# Patient Record
Sex: Male | Born: 1960 | Race: White | Hispanic: No | Marital: Married | Smoking: Former smoker
Health system: Southern US, Community
[De-identification: ages and names within clinical notes are randomized; demographics above are authoritative.]

## PROBLEM LIST (undated history)

## (undated) DIAGNOSIS — B019 Varicella without complication: Secondary | ICD-10-CM

## (undated) DIAGNOSIS — E079 Disorder of thyroid, unspecified: Secondary | ICD-10-CM

## (undated) DIAGNOSIS — E785 Hyperlipidemia, unspecified: Secondary | ICD-10-CM

## (undated) DIAGNOSIS — E039 Hypothyroidism, unspecified: Secondary | ICD-10-CM

## (undated) HISTORY — DX: Hyperlipidemia, unspecified: E78.5

## (undated) HISTORY — DX: Varicella without complication: B01.9

## (undated) HISTORY — DX: Disorder of thyroid, unspecified: E07.9

## (undated) HISTORY — PX: OTHER SURGICAL HISTORY: SHX169

---

## 2015-07-14 ENCOUNTER — Encounter: Payer: Self-pay | Admitting: Family Medicine

## 2015-07-14 ENCOUNTER — Ambulatory Visit (INDEPENDENT_AMBULATORY_CARE_PROVIDER_SITE_OTHER): Payer: BLUE CROSS/BLUE SHIELD | Admitting: Family Medicine

## 2015-07-14 VITALS — BP 140/90 | HR 86 | Temp 98.3°F | Ht 69.0 in | Wt 193.3 lb

## 2015-07-14 DIAGNOSIS — R221 Localized swelling, mass and lump, neck: Secondary | ICD-10-CM | POA: Diagnosis not present

## 2015-07-14 DIAGNOSIS — E782 Mixed hyperlipidemia: Secondary | ICD-10-CM | POA: Insufficient documentation

## 2015-07-14 DIAGNOSIS — E039 Hypothyroidism, unspecified: Secondary | ICD-10-CM

## 2015-07-14 DIAGNOSIS — E785 Hyperlipidemia, unspecified: Secondary | ICD-10-CM | POA: Diagnosis not present

## 2015-07-14 LAB — T4, FREE: FREE T4: 1.01 ng/dL (ref 0.60–1.60)

## 2015-07-14 LAB — TSH: TSH: 1.75 u[IU]/mL (ref 0.35–4.50)

## 2015-07-14 NOTE — Progress Notes (Signed)
   Subjective:    Patient ID: Carl Barton, male    DOB: 1961-01-16, 55 y.o.   MRN: [id number]  HPI Patient seen to establish care. Past medical history significant for hypothyroidism and hyperlipidemia  Currently takes levothyroxin 100 g once daily. He is concerned because over the past few months he thinks he has had some gradual bilateral enlargement of his neck with some associated soreness bilaterally. He states he had his blood work checked for thyroid several months ago and normal. No history of known multinodular goiter or thyroiditis. No recent appetite or weight changes. No history of dysphagia or pain with swallowing  Hyperlipidemia treated with Crestor 10 mg daily. No history of CAD or peripheral vascular disease.  Complains of some general fatigue issues.  Usually gets about 6-7 hours sleep at night. Gets to sleep okay but frequently wakes up and unable to get back to sleep. No history of known obstructive sleep apnea. Wife has not observed any obvious apnea episodes. Minimal snoring.  Past Medical History  Diagnosis Date  . Chicken pox   . Thyroid disease    Past Surgical History  Procedure Laterality Date  . Never      reports that he quit smoking about 18 years ago. He does not have any smokeless tobacco history on file. He reports that he does not drink alcohol or use illicit drugs. family history includes Arthritis in his mother. Not on File    Review of Systems  Constitutional: Positive for fatigue.  Eyes: Negative for visual disturbance.  Respiratory: Negative for cough, chest tightness and shortness of breath.   Cardiovascular: Negative for chest pain, palpitations and leg swelling.  Endocrine: Negative for polydipsia and polyuria.  Genitourinary: Negative for dysuria.  Neurological: Negative for dizziness, syncope, weakness, light-headedness and headaches.       Objective:   Physical Exam  Constitutional: He appears well-developed and  well-nourished.  Neck: Neck supple.  Patient has somewhat prominent submandibular glands bilaterally. He also has some bilateral anterior cervical adenopathy. No definite lower neck masses palpated.  Cardiovascular: Normal rate and regular rhythm.  Exam reveals no gallop.   No murmur heard. Pulmonary/Chest: Effort normal and breath sounds normal. No respiratory distress. He has no wheezes. He has no rales.  Musculoskeletal: He exhibits no edema.          Assessment & Plan:  #1 hypothyroidism. Recheck TSH and free T4 #2 dyslipidemia. Patient on Crestor. Reportedly had lipids checked a few months ago and normal #3 anterior neck prominence. Check neck ultrasound to further assess.

## 2015-07-14 NOTE — Patient Instructions (Signed)
We will call you with neck ultrasound

## 2015-07-14 NOTE — Progress Notes (Signed)
Pre visit review using our clinic review tool, if applicable. No additional management support is needed unless otherwise documented below in the visit note. 

## 2015-07-21 ENCOUNTER — Ambulatory Visit
Admission: RE | Admit: 2015-07-21 | Discharge: 2015-07-21 | Disposition: A | Discharge status: Home / Self Care | Payer: BLUE CROSS/BLUE SHIELD | Source: Ambulatory Visit | Attending: Family Medicine | Admitting: Family Medicine

## 2015-07-21 DIAGNOSIS — R221 Localized swelling, mass and lump, neck: Secondary | ICD-10-CM

## 2015-07-31 LAB — LIPID PANEL
CHOLESTEROL: 143 mg/dL (ref 0–200)
HDL: 33 mg/dL — AB (ref 35–70)
LDL Cholesterol: 75 mg/dL
Triglycerides: 177 mg/dL — AB (ref 40–160)

## 2015-07-31 LAB — HEPATIC FUNCTION PANEL
ALT: 18 U/L (ref 10–40)
AST: 14 U/L (ref 14–40)
Alkaline Phosphatase: 83 U/L (ref 25–125)
Bilirubin, Total: 0.4 mg/dL

## 2015-07-31 LAB — BASIC METABOLIC PANEL
BUN: 13 mg/dL (ref 4–21)
CREATININE: 0.9 mg/dL (ref 0.6–1.3)
Glucose: 102 mg/dL
POTASSIUM: 4.1 mmol/L (ref 3.4–5.3)
SODIUM: 143 mmol/L (ref 137–147)

## 2015-08-21 DIAGNOSIS — E782 Mixed hyperlipidemia: Secondary | ICD-10-CM | POA: Diagnosis not present

## 2015-08-21 DIAGNOSIS — E039 Hypothyroidism, unspecified: Secondary | ICD-10-CM | POA: Diagnosis not present

## 2015-08-21 DIAGNOSIS — Z008 Encounter for other general examination: Secondary | ICD-10-CM | POA: Diagnosis not present

## 2015-08-21 DIAGNOSIS — E559 Vitamin D deficiency, unspecified: Secondary | ICD-10-CM | POA: Diagnosis not present

## 2015-09-12 ENCOUNTER — Encounter: Payer: Self-pay | Admitting: Family Medicine

## 2016-01-31 DIAGNOSIS — Z139 Encounter for screening, unspecified: Secondary | ICD-10-CM | POA: Diagnosis not present

## 2016-01-31 DIAGNOSIS — E039 Hypothyroidism, unspecified: Secondary | ICD-10-CM | POA: Diagnosis not present

## 2016-01-31 DIAGNOSIS — Z008 Encounter for other general examination: Secondary | ICD-10-CM | POA: Diagnosis not present

## 2016-01-31 DIAGNOSIS — Z79899 Other long term (current) drug therapy: Secondary | ICD-10-CM | POA: Diagnosis not present

## 2016-01-31 DIAGNOSIS — E559 Vitamin D deficiency, unspecified: Secondary | ICD-10-CM | POA: Diagnosis not present

## 2016-01-31 DIAGNOSIS — E782 Mixed hyperlipidemia: Secondary | ICD-10-CM | POA: Diagnosis not present

## 2016-02-21 DIAGNOSIS — E559 Vitamin D deficiency, unspecified: Secondary | ICD-10-CM | POA: Diagnosis not present

## 2016-02-21 DIAGNOSIS — E039 Hypothyroidism, unspecified: Secondary | ICD-10-CM | POA: Diagnosis not present

## 2016-02-21 DIAGNOSIS — Z008 Encounter for other general examination: Secondary | ICD-10-CM | POA: Diagnosis not present

## 2016-02-21 DIAGNOSIS — E782 Mixed hyperlipidemia: Secondary | ICD-10-CM | POA: Diagnosis not present

## 2016-02-27 DIAGNOSIS — Z23 Encounter for immunization: Secondary | ICD-10-CM | POA: Diagnosis not present

## 2016-06-03 DIAGNOSIS — E782 Mixed hyperlipidemia: Secondary | ICD-10-CM | POA: Diagnosis not present

## 2016-06-03 DIAGNOSIS — Z008 Encounter for other general examination: Secondary | ICD-10-CM | POA: Diagnosis not present

## 2016-06-03 DIAGNOSIS — Z79899 Other long term (current) drug therapy: Secondary | ICD-10-CM | POA: Diagnosis not present

## 2016-06-03 DIAGNOSIS — E559 Vitamin D deficiency, unspecified: Secondary | ICD-10-CM | POA: Diagnosis not present

## 2016-06-03 DIAGNOSIS — E039 Hypothyroidism, unspecified: Secondary | ICD-10-CM | POA: Diagnosis not present

## 2016-06-24 DIAGNOSIS — E039 Hypothyroidism, unspecified: Secondary | ICD-10-CM | POA: Diagnosis not present

## 2016-06-24 DIAGNOSIS — E782 Mixed hyperlipidemia: Secondary | ICD-10-CM | POA: Diagnosis not present

## 2016-06-24 DIAGNOSIS — E559 Vitamin D deficiency, unspecified: Secondary | ICD-10-CM | POA: Diagnosis not present

## 2016-09-19 IMAGING — US US SOFT TISSUE HEAD/NECK
1 series · 14 of 25 positions shown · non-contrast
Comparison: None.

CLINICAL DATA: 54-year-old male with a history of neck puffiness

EXAM:
ULTRASOUND OF HEAD/NECK SOFT TISSUES
TECHNIQUE: Ultrasound examination of the head and neck soft tissues was
performed in the area of clinical concern.

[Series 1: us soft tissue head/neck · 0.08mm/px · 14 of 33 slices shown]
[im 1/33]
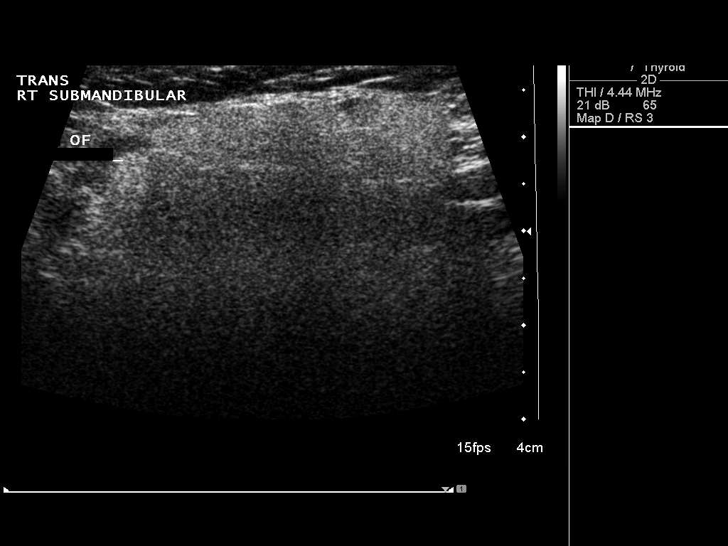
[im 3/33]
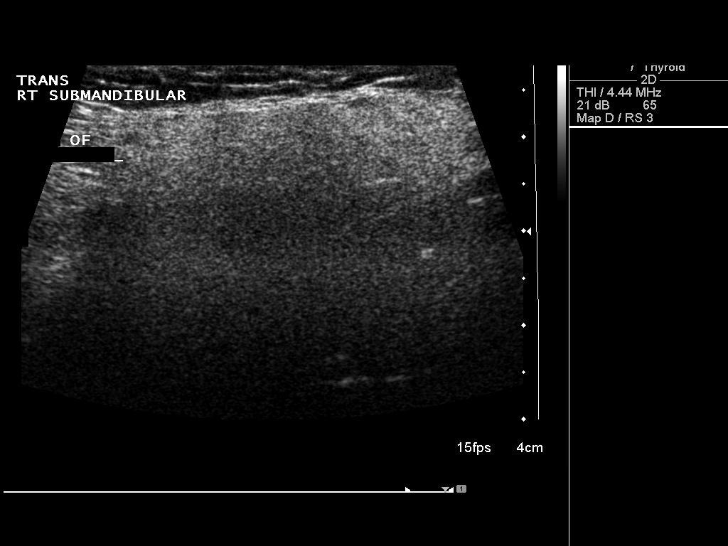
[im 6/33]
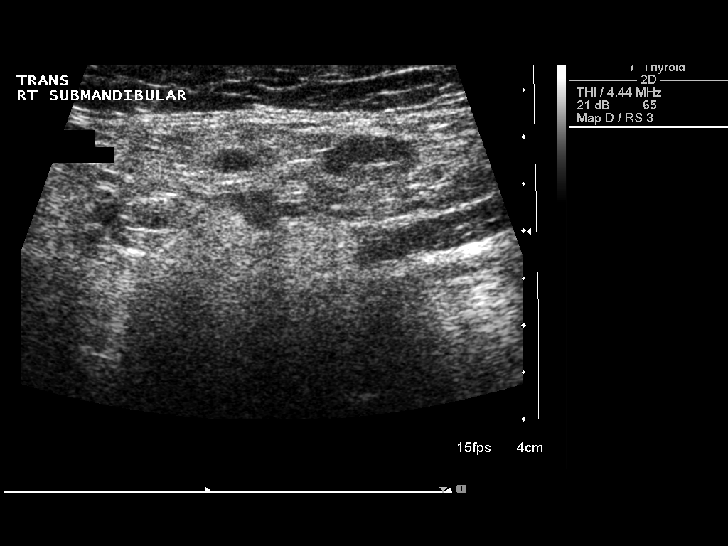
[im 9/33]
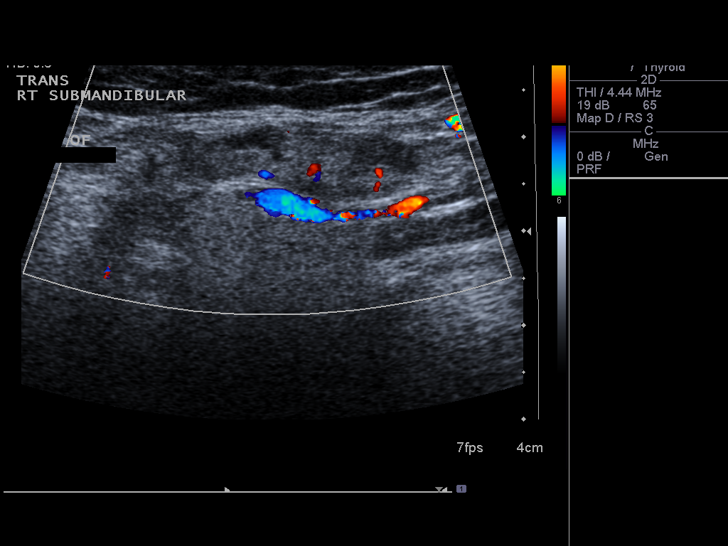
[im 11/33]
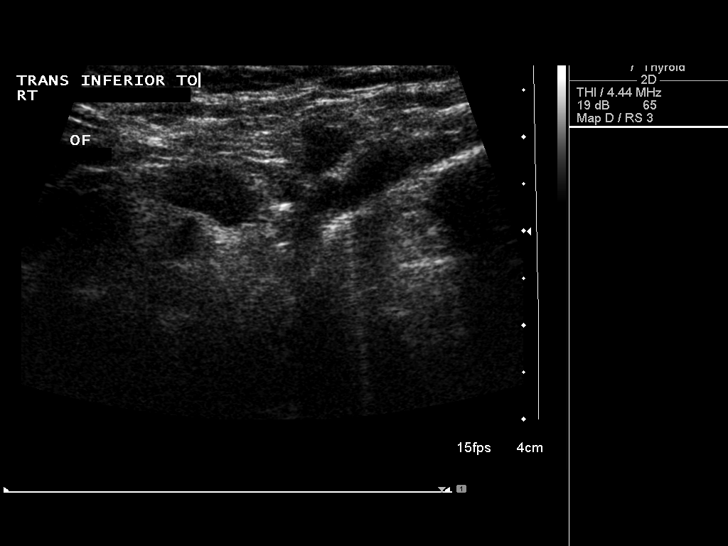
[im 13/33]
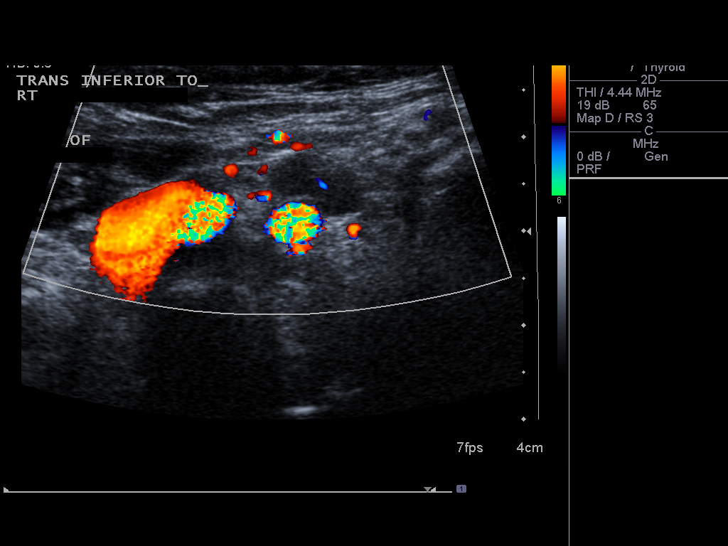
[im 15/33]
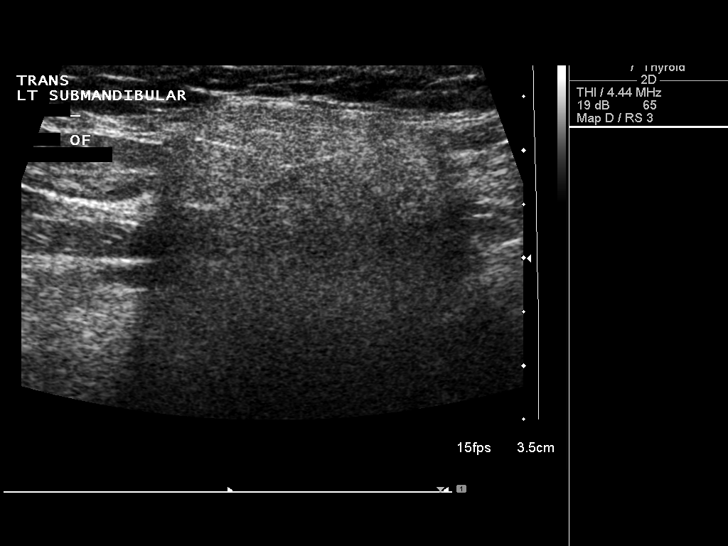
[im 18/33]
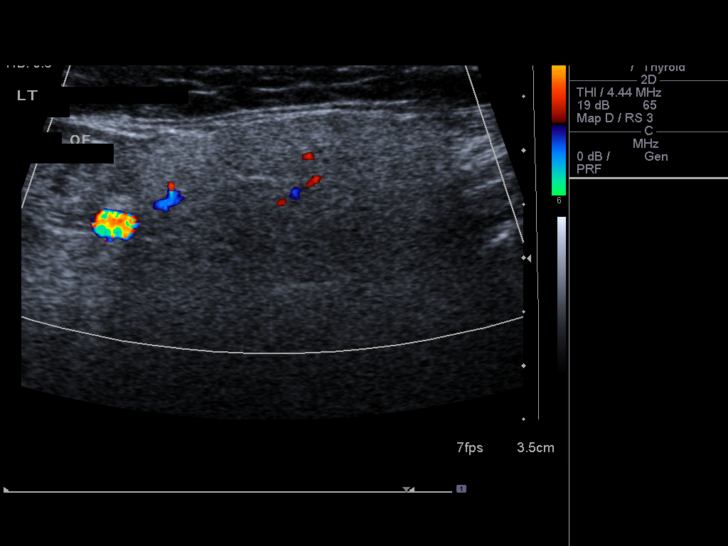
[im 21/33]
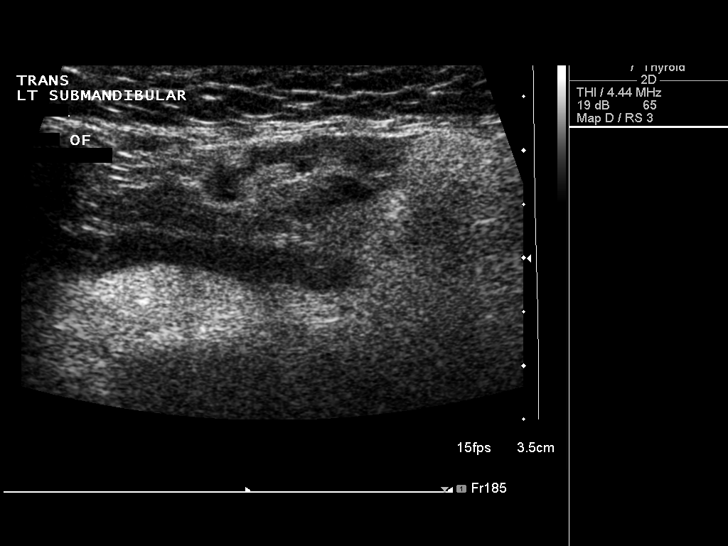
[im 22/33]
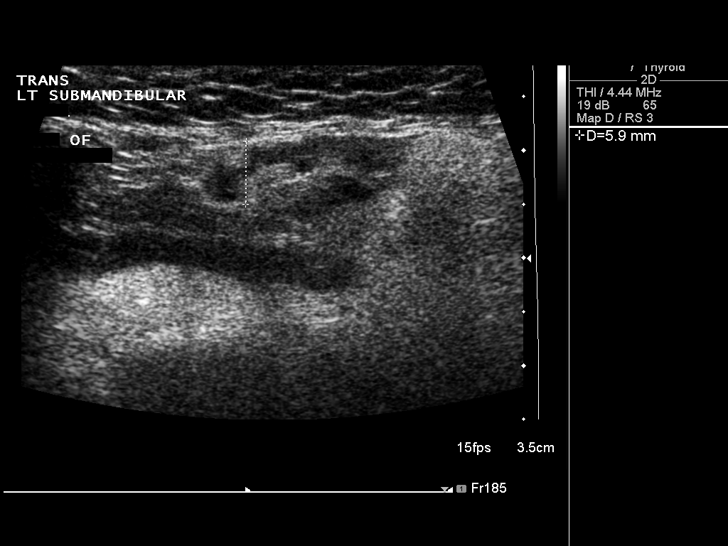
[im 25/33]
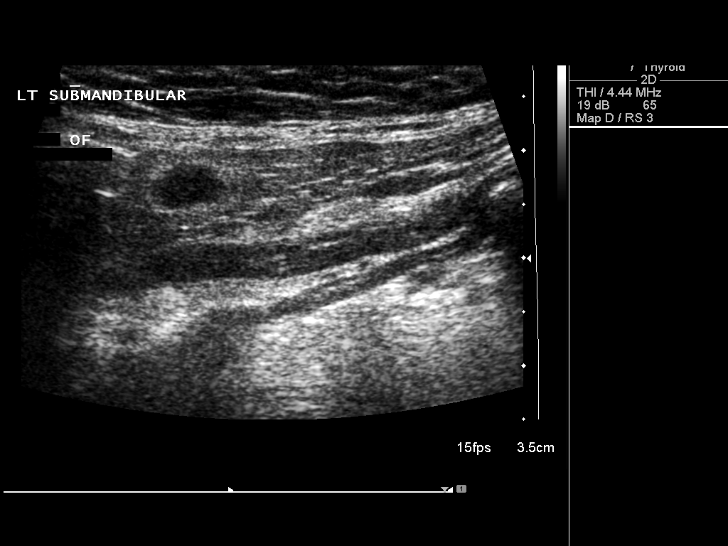
[im 27/33]
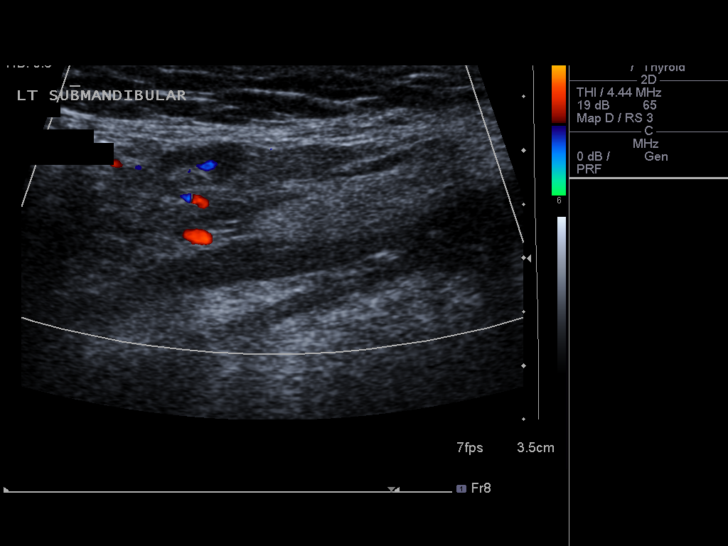
[im 30/33]
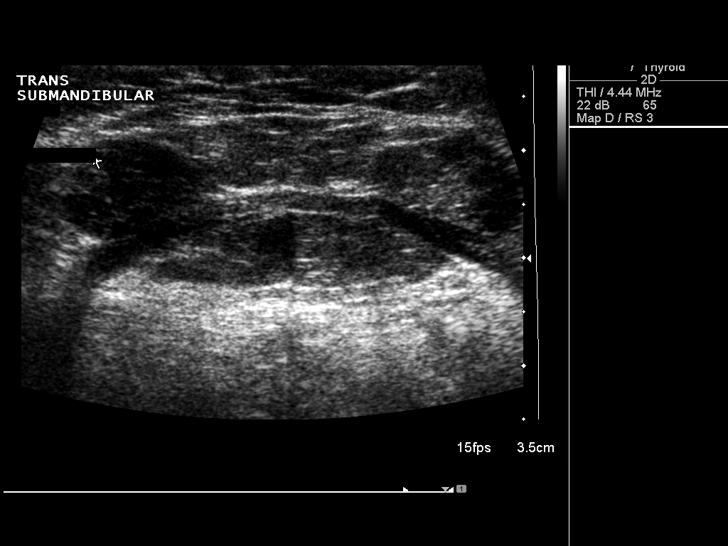
[im 33/33]
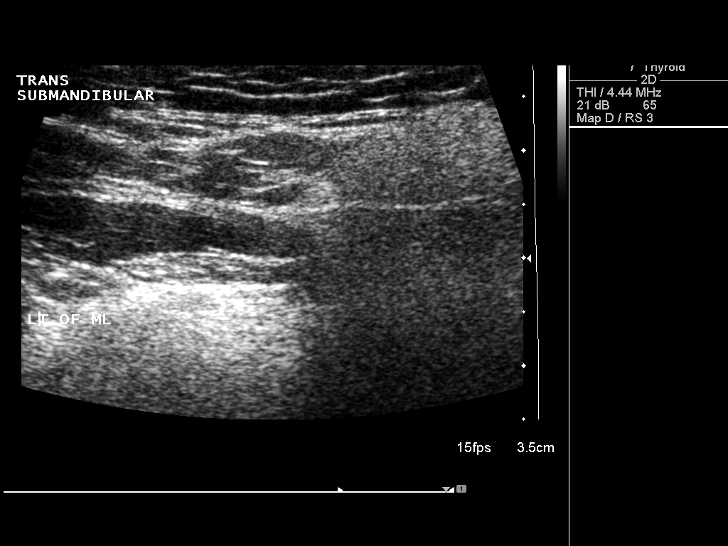

[14 of 25 positions shown; findings below may reference images not displayed]

FINDINGS: Grayscale and color duplex imaging in the region of clinical concern
performed.

No focal fluid collection. No soft tissue lesion. No calcifications.

Heterogeneous appearance of the submandibular glands, typical for
salivary tissue.

Lymph nodes are present, which are not enlarged by head and neck
criteria.
IMPRESSION: Unremarkable sonographic survey in the region of clinical concern.

## 2016-10-09 DIAGNOSIS — E782 Mixed hyperlipidemia: Secondary | ICD-10-CM | POA: Diagnosis not present

## 2016-10-09 DIAGNOSIS — Z719 Counseling, unspecified: Secondary | ICD-10-CM | POA: Diagnosis not present

## 2016-10-09 DIAGNOSIS — Z79899 Other long term (current) drug therapy: Secondary | ICD-10-CM | POA: Diagnosis not present

## 2016-10-09 DIAGNOSIS — E039 Hypothyroidism, unspecified: Secondary | ICD-10-CM | POA: Diagnosis not present

## 2016-10-23 DIAGNOSIS — E782 Mixed hyperlipidemia: Secondary | ICD-10-CM | POA: Diagnosis not present

## 2016-10-23 DIAGNOSIS — E559 Vitamin D deficiency, unspecified: Secondary | ICD-10-CM | POA: Diagnosis not present

## 2016-10-23 DIAGNOSIS — Z008 Encounter for other general examination: Secondary | ICD-10-CM | POA: Diagnosis not present

## 2016-10-23 DIAGNOSIS — Z719 Counseling, unspecified: Secondary | ICD-10-CM | POA: Diagnosis not present

## 2016-10-23 DIAGNOSIS — E039 Hypothyroidism, unspecified: Secondary | ICD-10-CM | POA: Diagnosis not present

## 2016-11-01 ENCOUNTER — Encounter: Payer: Self-pay | Admitting: Family Medicine

## 2016-11-01 ENCOUNTER — Ambulatory Visit (INDEPENDENT_AMBULATORY_CARE_PROVIDER_SITE_OTHER): Payer: BLUE CROSS/BLUE SHIELD | Admitting: Family Medicine

## 2016-11-01 VITALS — BP 133/79 | HR 89 | Temp 97.0°F | Ht 69.0 in | Wt 188.0 lb

## 2016-11-01 DIAGNOSIS — E781 Pure hyperglyceridemia: Secondary | ICD-10-CM

## 2016-11-01 DIAGNOSIS — Z0000 Encounter for general adult medical examination without abnormal findings: Secondary | ICD-10-CM | POA: Diagnosis not present

## 2016-11-01 MED ORDER — ICOSAPENT ETHYL 1 G PO CAPS: 2.0000 g | ORAL_CAPSULE | Freq: Two times a day (BID) | ORAL | 3 refills | Status: DC

## 2016-11-01 NOTE — Progress Notes (Signed)
   HPI  Patient presents today for an annual physical exam.  Patient feels well, has no complaints.  Patient has history of hyperlipidemia and hypertriglyceridemia, he is watching his diet moderately. He is occupationally active. He has tried Lipitor, Crestor, to other statins, and fenofibrate which I'll start/cause myalgias.  He would like to get a colonoscopy.  He does not have any prostate symptoms, no nocturia. PSA has been checked by occupational provider and is normal.  He brings in serial labs which were reviewed.  PMH: Chickenpox, hyperlipidemia, hypothyroidism Family history: Arthritis in mother Surgical history: Never Social history: No alcohol or drug use. Former smoker ROS: Per HPI  Objective: BP 133/79   Pulse 89   Temp 97 F (36.1 C) (Oral)   Ht 5\' 9"  (1.753 m)   Wt 188 lb (85.3 kg)   BMI 27.76 kg/m  Gen: NAD, alert, cooperative with exam HEENT: NCAT, EOMI, PERRL, oropharynx clear, nares clear CV: RRR, good S1/S2, no murmur Resp: CTABL, no wheezes, non-labored Abd: SNTND, BS present, no guarding or organomegaly Ext: No edema, warm Neuro: Alert and oriented, 2+ symmetric patellar tendon reflexes, strength 5/5 in bilateral lower extremities  Plan:    Annual physical exam Normal exam, slightly overweight- therapeutic lifestyle changes Sent to GI for colonoscopy  Hypertriglyceridemia Start Vascepa Statin intolerance, does have HLD as well.   # hypothyroidism TSH controlled, no change in synthroid dose    Murtis SinkSam Samary Shatz, MD [facility] 11/01/2016, 1:34 PM

## 2016-11-01 NOTE — Patient Instructions (Signed)
Great to meet you!   Health Maintenance, Male A healthy lifestyle and preventive care is important for your health and wellness. Ask your health care provider about what schedule of regular examinations is right for you. What should I know about weight and diet? Eat a Healthy Diet  Eat plenty of vegetables, fruits, whole grains, low-fat dairy products, and lean protein.  Do not eat a lot of foods high in solid fats, added sugars, or salt.  Maintain a Healthy Weight Regular exercise can help you achieve or maintain a healthy weight. You should:  Do at least 150 minutes of exercise each week. The exercise should increase your heart rate and make you sweat (moderate-intensity exercise).  Do strength-training exercises at least twice a week.  Watch Your Levels of Cholesterol and Blood Lipids  Have your blood tested for lipids and cholesterol every 5 years starting at 58 years of age. If you are at high risk for heart disease, you should start having your blood tested when you are 58 years old. You may need to have your cholesterol levels checked more often if: ? Your lipid or cholesterol levels are high. ? You are older than 58 years of age. ? You are at high risk for heart disease.  What should I know about cancer screening? Many types of cancers can be detected early and may often be prevented. Lung Cancer  You should be screened every year for lung cancer if: ? You are a current smoker who has smoked for at least 30 years. ? You are a former smoker who has quit within the past 15 years.  Talk to your health care provider about your screening options, when you should start screening, and how often you should be screened.  Colorectal Cancer  Routine colorectal cancer screening usually begins at 58 years of age and should be repeated every 5-10 years until you are 58 years old. You may need to be screened more often if early forms of precancerous polyps or small growths are found.  Your health care provider may recommend screening at an earlier age if you have risk factors for colon cancer.  Your health care provider may recommend using home test kits to check for hidden blood in the stool.  A small camera at the end of a tube can be used to examine your colon (sigmoidoscopy or colonoscopy). This checks for the earliest forms of colorectal cancer.  Prostate and Testicular Cancer  Depending on your age and overall health, your health care provider may do certain tests to screen for prostate and testicular cancer.  Talk to your health care provider about any symptoms or concerns you have about testicular or prostate cancer.  Skin Cancer  Check your skin from head to toe regularly.  Tell your health care provider about any new moles or changes in moles, especially if: ? There is a change in a mole's size, shape, or color. ? You have a mole that is larger than a pencil eraser.  Always use sunscreen. Apply sunscreen liberally and repeat throughout the day.  Protect yourself by wearing long sleeves, pants, a wide-brimmed hat, and sunglasses when outside.  What should I know about heart disease, diabetes, and high blood pressure?  If you are 18-39 years of age, have your blood pressure checked every 3-5 years. If you are 40 years of age or older, have your blood pressure checked every year. You should have your blood pressure measured twice-once when you are at a   hospital or clinic, and once when you are not at a hospital or clinic. Record the average of the two measurements. To check your blood pressure when you are not at a hospital or clinic, you can use: ? An automated blood pressure machine at a pharmacy. ? A home blood pressure monitor.  Talk to your health care provider about your target blood pressure.  If you are between 45-79 years old, ask your health care provider if you should take aspirin to prevent heart disease.  Have regular diabetes screenings by  checking your fasting blood sugar level. ? If you are at a normal weight and have a low risk for diabetes, have this test once every three years after the age of 45. ? If you are overweight and have a high risk for diabetes, consider being tested at a younger age or more often.  A one-time screening for abdominal aortic aneurysm (AAA) by ultrasound is recommended for men aged 65-75 years who are current or former smokers. What should I know about preventing infection? Hepatitis B If you have a higher risk for hepatitis B, you should be screened for this virus. Talk with your health care provider to find out if you are at risk for hepatitis B infection. Hepatitis C Blood testing is recommended for:  Everyone born from 1945 through 1965.  Anyone with known risk factors for hepatitis C.  Sexually Transmitted Diseases (STDs)  You should be screened each year for STDs including gonorrhea and chlamydia if: ? You are sexually active and are younger than 58 years of age. ? You are older than 58 years of age and your health care provider tells you that you are at risk for this type of infection. ? Your sexual activity has changed since you were last screened and you are at an increased risk for chlamydia or gonorrhea. Ask your health care provider if you are at risk.  Talk with your health care provider about whether you are at high risk of being infected with HIV. Your health care provider may recommend a prescription medicine to help prevent HIV infection.  What else can I do?  Schedule regular health, dental, and eye exams.  Stay current with your vaccines (immunizations).  Do not use any tobacco products, such as cigarettes, chewing tobacco, and e-cigarettes. If you need help quitting, ask your health care provider.  Limit alcohol intake to no more than 2 drinks per day. One drink equals 12 ounces of beer, 5 ounces of wine, or 1 ounces of hard liquor.  Do not use street drugs.  Do not  share needles.  Ask your health care provider for help if you need support or information about quitting drugs.  Tell your health care provider if you often feel depressed.  Tell your health care provider if you have ever been abused or do not feel safe at home. This information is not intended to replace advice given to you by your health care provider. Make sure you discuss any questions you have with your health care provider. Document Released: 11/02/2007 Document Revised: 01/03/2016 Document Reviewed: 02/07/2015 Elsevier Interactive Patient Education  2018 [employer]  

## 2016-12-13 DIAGNOSIS — Z01818 Encounter for other preprocedural examination: Secondary | ICD-10-CM | POA: Diagnosis not present

## 2016-12-30 DIAGNOSIS — Z008 Encounter for other general examination: Secondary | ICD-10-CM | POA: Diagnosis not present

## 2016-12-30 DIAGNOSIS — E039 Hypothyroidism, unspecified: Secondary | ICD-10-CM | POA: Diagnosis not present

## 2016-12-30 DIAGNOSIS — E782 Mixed hyperlipidemia: Secondary | ICD-10-CM | POA: Diagnosis not present

## 2016-12-30 DIAGNOSIS — E559 Vitamin D deficiency, unspecified: Secondary | ICD-10-CM | POA: Diagnosis not present

## 2016-12-30 DIAGNOSIS — Z719 Counseling, unspecified: Secondary | ICD-10-CM | POA: Diagnosis not present

## 2016-12-30 DIAGNOSIS — E663 Overweight: Secondary | ICD-10-CM | POA: Diagnosis not present

## 2017-01-08 DIAGNOSIS — D126 Benign neoplasm of colon, unspecified: Secondary | ICD-10-CM | POA: Diagnosis not present

## 2017-01-08 DIAGNOSIS — Z1211 Encounter for screening for malignant neoplasm of colon: Secondary | ICD-10-CM | POA: Diagnosis not present

## 2017-01-08 DIAGNOSIS — K6289 Other specified diseases of anus and rectum: Secondary | ICD-10-CM | POA: Diagnosis not present

## 2017-01-08 DIAGNOSIS — K644 Residual hemorrhoidal skin tags: Secondary | ICD-10-CM | POA: Diagnosis not present

## 2017-01-08 DIAGNOSIS — K648 Other hemorrhoids: Secondary | ICD-10-CM | POA: Diagnosis not present

## 2017-01-13 DIAGNOSIS — D126 Benign neoplasm of colon, unspecified: Secondary | ICD-10-CM | POA: Diagnosis not present

## 2017-02-10 DIAGNOSIS — Z79899 Other long term (current) drug therapy: Secondary | ICD-10-CM | POA: Diagnosis not present

## 2017-02-10 DIAGNOSIS — E782 Mixed hyperlipidemia: Secondary | ICD-10-CM | POA: Diagnosis not present

## 2017-02-10 DIAGNOSIS — Z139 Encounter for screening, unspecified: Secondary | ICD-10-CM | POA: Diagnosis not present

## 2017-02-10 DIAGNOSIS — E039 Hypothyroidism, unspecified: Secondary | ICD-10-CM | POA: Diagnosis not present

## 2017-02-10 DIAGNOSIS — E559 Vitamin D deficiency, unspecified: Secondary | ICD-10-CM | POA: Diagnosis not present

## 2017-02-19 DIAGNOSIS — Z008 Encounter for other general examination: Secondary | ICD-10-CM | POA: Diagnosis not present

## 2017-02-19 DIAGNOSIS — E559 Vitamin D deficiency, unspecified: Secondary | ICD-10-CM | POA: Diagnosis not present

## 2017-02-19 DIAGNOSIS — E039 Hypothyroidism, unspecified: Secondary | ICD-10-CM | POA: Diagnosis not present

## 2017-02-19 DIAGNOSIS — E663 Overweight: Secondary | ICD-10-CM | POA: Diagnosis not present

## 2017-02-19 DIAGNOSIS — Z719 Counseling, unspecified: Secondary | ICD-10-CM | POA: Diagnosis not present

## 2017-02-19 DIAGNOSIS — E782 Mixed hyperlipidemia: Secondary | ICD-10-CM | POA: Diagnosis not present

## 2017-07-21 ENCOUNTER — Encounter: Payer: Self-pay | Admitting: Family Medicine

## 2017-07-21 DIAGNOSIS — Z139 Encounter for screening, unspecified: Secondary | ICD-10-CM | POA: Diagnosis not present

## 2017-07-21 DIAGNOSIS — Z79899 Other long term (current) drug therapy: Secondary | ICD-10-CM | POA: Diagnosis not present

## 2017-07-21 DIAGNOSIS — Z719 Counseling, unspecified: Secondary | ICD-10-CM | POA: Diagnosis not present

## 2017-07-21 DIAGNOSIS — E559 Vitamin D deficiency, unspecified: Secondary | ICD-10-CM | POA: Diagnosis not present

## 2017-07-21 DIAGNOSIS — Z008 Encounter for other general examination: Secondary | ICD-10-CM | POA: Diagnosis not present

## 2017-07-21 DIAGNOSIS — E039 Hypothyroidism, unspecified: Secondary | ICD-10-CM | POA: Diagnosis not present

## 2017-08-04 DIAGNOSIS — E559 Vitamin D deficiency, unspecified: Secondary | ICD-10-CM | POA: Diagnosis not present

## 2017-08-04 DIAGNOSIS — E039 Hypothyroidism, unspecified: Secondary | ICD-10-CM | POA: Diagnosis not present

## 2017-08-04 DIAGNOSIS — E782 Mixed hyperlipidemia: Secondary | ICD-10-CM | POA: Diagnosis not present

## 2017-10-06 DIAGNOSIS — Z719 Counseling, unspecified: Secondary | ICD-10-CM | POA: Diagnosis not present

## 2017-10-06 DIAGNOSIS — E039 Hypothyroidism, unspecified: Secondary | ICD-10-CM | POA: Diagnosis not present

## 2017-10-06 DIAGNOSIS — E782 Mixed hyperlipidemia: Secondary | ICD-10-CM | POA: Diagnosis not present

## 2017-10-06 DIAGNOSIS — E559 Vitamin D deficiency, unspecified: Secondary | ICD-10-CM | POA: Diagnosis not present

## 2017-10-06 DIAGNOSIS — E663 Overweight: Secondary | ICD-10-CM | POA: Diagnosis not present

## 2017-10-06 DIAGNOSIS — Z008 Encounter for other general examination: Secondary | ICD-10-CM | POA: Diagnosis not present

## 2017-12-03 DIAGNOSIS — Z008 Encounter for other general examination: Secondary | ICD-10-CM | POA: Diagnosis not present

## 2017-12-03 DIAGNOSIS — E559 Vitamin D deficiency, unspecified: Secondary | ICD-10-CM | POA: Diagnosis not present

## 2017-12-03 DIAGNOSIS — E039 Hypothyroidism, unspecified: Secondary | ICD-10-CM | POA: Diagnosis not present

## 2017-12-03 DIAGNOSIS — Z719 Counseling, unspecified: Secondary | ICD-10-CM | POA: Diagnosis not present

## 2018-01-28 DIAGNOSIS — Z79899 Other long term (current) drug therapy: Secondary | ICD-10-CM | POA: Diagnosis not present

## 2018-01-28 DIAGNOSIS — Z139 Encounter for screening, unspecified: Secondary | ICD-10-CM | POA: Diagnosis not present

## 2018-01-28 DIAGNOSIS — E782 Mixed hyperlipidemia: Secondary | ICD-10-CM | POA: Diagnosis not present

## 2018-01-28 DIAGNOSIS — Z0130 Encounter for examination of blood pressure without abnormal findings: Secondary | ICD-10-CM | POA: Diagnosis not present

## 2018-01-28 DIAGNOSIS — E559 Vitamin D deficiency, unspecified: Secondary | ICD-10-CM | POA: Diagnosis not present

## 2018-01-28 DIAGNOSIS — E039 Hypothyroidism, unspecified: Secondary | ICD-10-CM | POA: Diagnosis not present

## 2018-02-10 ENCOUNTER — Encounter: Payer: Self-pay | Admitting: Family Medicine

## 2018-02-10 ENCOUNTER — Ambulatory Visit (INDEPENDENT_AMBULATORY_CARE_PROVIDER_SITE_OTHER): Payer: BLUE CROSS/BLUE SHIELD | Admitting: Family Medicine

## 2018-02-10 VITALS — BP 138/82 | HR 86 | Temp 98.5°F | Ht 69.0 in | Wt 188.0 lb

## 2018-02-10 DIAGNOSIS — E039 Hypothyroidism, unspecified: Secondary | ICD-10-CM

## 2018-02-10 DIAGNOSIS — E782 Mixed hyperlipidemia: Secondary | ICD-10-CM

## 2018-02-10 DIAGNOSIS — Z0000 Encounter for general adult medical examination without abnormal findings: Secondary | ICD-10-CM

## 2018-02-10 DIAGNOSIS — R030 Elevated blood-pressure reading, without diagnosis of hypertension: Secondary | ICD-10-CM

## 2018-02-10 DIAGNOSIS — Z23 Encounter for immunization: Secondary | ICD-10-CM

## 2018-02-10 NOTE — Progress Notes (Signed)
Subjective:    Patient ID: Carl Barton, male    DOB: November 24, 1960, 57 y.o.   MRN: [id number]  Chief Complaint:  Annual Exam   HPI: Carl Barton is a 57 y.o. male presenting on 02/10/2018 for Annual Exam  Pt presents today for his annual physical exam. Pt states he is ding well overall.  Pt reports he has been watching his diet and has increased his physical activity. Pt reports he continues to take fish oil, as he was intolerant to statins. Pt denies chest pain, headaches, shortness of breath, or dizziness. Pt is also compliant with his levothyroxine. He denies cold or heat intolerance, dry skin, constipation/diarrhea, fatigue, or mood changes. He denies complaints or concerns. Pt denies urinary symptoms such as nocturia, urgency, or hesitancy. Pt does want to have his PSA checked today.   Pt states he had his colonoscopy at [facility] in [place] in August 2018, we will request results.   Pt states he just had blood work done at work this week. Upon reviewing records from previous blood draws from his employer it is noted they check a CMP and lipid panel. Other labs will be drawn today.   1. Annual physical exam   2. Hypothyroidism, unspecified type   3. Mixed hyperlipidemia   4. Elevated blood pressure reading without diagnosis of hypertension      Relevant past medical, surgical, family and social history reviewed and updated as indicated. Interim medical history since our last visit reviewed. Allergies and medications reviewed and updated. DATA REVIEWED: CHART IN EPIC  Family History reviewed for pertinent findings.  Past Medical History:  Diagnosis Date  . Chicken pox   . Hyperlipidemia   . Thyroid disease     Past Surgical History:  Procedure Laterality Date  . never      Social History   Socioeconomic History  . Marital status: Married    Spouse name: Not on file  . Number of children: Not on file  . Years of education: Not on file  .  Highest education level: Not on file  Occupational History  . Not on file  Social Needs  . Financial resource strain: Not on file  . Food insecurity:    Worry: Not on file    Inability: Not on file  . Transportation needs:    Medical: Not on file    Non-medical: Not on file  Tobacco Use  . Smoking status: Former Smoker    Packs/day: 1.00    Years: 15.00    Pack years: 15.00    Types: Cigarettes    Last attempt to quit: 05/20/1997    Years since quitting: 20.7  . Smokeless tobacco: Never Used  Substance and Sexual Activity  . Alcohol use: No    Alcohol/week: 0.0 standard drinks  . Drug use: No  . Sexual activity: Yes  Lifestyle  . Physical activity:    Days per week: Not on file    Minutes per session: Not on file  . Stress: Not on file  Relationships  . Social connections:    Talks on phone: Not on file    Gets together: Not on file    Attends religious service: Not on file    Active member of club or organization: Not on file    Attends meetings of clubs or organizations: Not on file    Relationship status: Not on file  . Intimate partner violence:    Fear of current or  ex partner: Not on file    Emotionally abused: Not on file    Physically abused: Not on file    Forced sexual activity: Not on file  Other Topics Concern  . Not on file  Social History Narrative  . Not on file    Outpatient Encounter Medications as of 02/10/2018  Medication Sig  . levothyroxine (SYNTHROID, LEVOTHROID) 100 MCG tablet Take 100 mcg by mouth daily before breakfast.  . [DISCONTINUED] Icosapent Ethyl (VASCEPA) 1 g CAPS Take 2 g by mouth 2 (two) times daily.   No facility-administered encounter medications on file as of 02/10/2018.     No Known Allergies  Review of Systems  Constitutional: Negative for activity change, appetite change, chills, fatigue and fever.  HENT: Negative.   Eyes: Negative.   Respiratory: Negative for cough, chest tightness and shortness of breath.     Cardiovascular: Negative for chest pain, palpitations and leg swelling.  Gastrointestinal: Negative for blood in stool, constipation, diarrhea, nausea and vomiting.  Endocrine: Negative.  Negative for cold intolerance, heat intolerance, polydipsia, polyphagia and polyuria.  Genitourinary: Negative for decreased urine volume, difficulty urinating, dysuria, enuresis, frequency, hematuria and urgency.  Musculoskeletal: Negative for arthralgias and myalgias.  Skin: Negative.   Allergic/Immunologic: Negative.   Neurological: Negative for dizziness, weakness, light-headedness, numbness and headaches.  Hematological: Negative.   Psychiatric/Behavioral: Negative for confusion, hallucinations, sleep disturbance and suicidal ideas.  All other systems reviewed and are negative.       Objective:    BP 138/82 (BP Location: Right Arm, Cuff Size: Normal)   Pulse 86   Temp 98.5 F (36.9 C) (Oral)   Ht 5\' 9"  (1.753 m)   Wt 188 lb (85.3 kg)   BMI 27.76 kg/m    BP Readings from Last 3 Encounters:  02/10/18 138/82  11/01/16 133/79  07/14/15 140/90     Wt Readings from Last 3 Encounters:  02/10/18 188 lb (85.3 kg)  11/01/16 188 lb (85.3 kg)  07/14/15 193 lb 4.8 oz (87.7 kg)    Physical Exam  Constitutional: He is oriented to person, place, and time. He appears well-developed and well-nourished. He is cooperative. No distress.  HENT:  Head: Normocephalic and atraumatic.  Right Ear: Hearing, tympanic membrane, external ear and ear canal normal.  Left Ear: Hearing, tympanic membrane, external ear and ear canal normal.  Nose: Nose normal.  Mouth/Throat: Uvula is midline, oropharynx is clear and moist and mucous membranes are normal.  Eyes: Pupils are equal, round, and reactive to light. Conjunctivae, EOM and lids are normal.  Neck: Trachea normal, normal range of motion, full passive range of motion without pain and phonation normal. Neck supple. No tracheal deviation present. No thyromegaly  present.  Cardiovascular: Normal rate, regular rhythm, S1 normal, S2 normal, normal heart sounds, intact distal pulses and normal pulses. Exam reveals no gallop and no friction rub.  No murmur heard. Pulses:      Dorsalis pedis pulses are 2+ on the right side, and 2+ on the left side.       Posterior tibial pulses are 2+ on the right side, and 2+ on the left side.  Pulmonary/Chest: Effort normal and breath sounds normal. No respiratory distress. He has no wheezes.  Abdominal: Soft. Bowel sounds are normal. There is no hepatosplenomegaly. There is no tenderness. No hernia.  Musculoskeletal: Normal range of motion.  Lymphadenopathy:    He has no cervical adenopathy.  Neurological: He is alert and oriented to person, place, and time. He  has normal strength and normal reflexes. He displays a negative Romberg sign. GCS eye subscore is 4. GCS verbal subscore is 5. GCS motor subscore is 6.  Skin: Skin is warm and dry. Capillary refill takes less than 2 seconds. He is not diaphoretic.  Psychiatric: He has a normal mood and affect. His speech is normal and behavior is normal. Judgment and thought content normal.  Nursing note and vitals reviewed.       Assessment & Plan:   1. Annual physical exam Labs pending. Healthy lifestyle, diet, and exercise discussed.  - PSA, total and free - CBC with Differential/Platelet - TSH  2. Hypothyroidism, unspecified type Compliant with levothyroxine. Labs pending - TSH  3. Mixed hyperlipidemia Unable to tolerate statins, on daily over the counter fish oil. Diet and exercise encouraged. Awaiting lab results from employer.   4. Elevated blood pressure reading without diagnosis of hypertension Blood pressure is noted to be elevated today. DASH diet, keep a log of blood pressure over the next few weeks and bring in to follow-up visit. Diet and exercise discussed.   Continue all other maintenance medications.  Follow up plan: Return in about 6 months  (around 08/11/2018), or if symptoms worsen or fail to improve.  Educational handout given for health maintenance, DASH diet  The above assessment and management plan was discussed with the patient. The patient verbalized understanding of and has agreed to the management plan. Patient is aware to call the clinic if symptoms persist or worsen. Patient is aware when to return to the clinic for a follow-up visit. Patient educated on when it is appropriate to go to the emergency department.   Kari Baars, FNP-C [facility] [phone]

## 2018-02-10 NOTE — Patient Instructions (Signed)
DASH Eating Plan DASH stands for "Dietary Approaches to Stop Hypertension." The DASH eating plan is a healthy eating plan that has been shown to reduce high blood pressure (hypertension). It may also reduce your risk for type 2 diabetes, heart disease, and stroke. The DASH eating plan may also help with weight loss. What are tips for following this plan? General guidelines  Avoid eating more than 2,300 mg (milligrams) of salt (sodium) a day. If you have hypertension, you may need to reduce your sodium intake to 1,500 mg a day.  Limit alcohol intake to no more than 1 drink a day for nonpregnant women and 2 drinks a day for men. One drink equals 12 oz of beer, 5 oz of wine, or 1 oz of hard liquor.  Work with your health care provider to maintain a healthy body weight or to lose weight. Ask what an ideal weight is for you.  Get at least 30 minutes of exercise that causes your heart to beat faster (aerobic exercise) most days of the week. Activities may include walking, swimming, or biking.  Work with your health care provider or diet and nutrition specialist (dietitian) to adjust your eating plan to your individual calorie needs. Reading food labels  Check food labels for the amount of sodium per serving. Choose foods with less than 5 percent of the Daily Value of sodium. Generally, foods with less than 300 mg of sodium per serving fit into this eating plan.  To find whole grains, look for the word "whole" as the first word in the ingredient list. Shopping  Buy products labeled as "low-sodium" or "no salt added."  Buy fresh foods. Avoid canned foods and premade or frozen meals. Cooking  Avoid adding salt when cooking. Use salt-free seasonings or herbs instead of table salt or sea salt. Check with your health care provider or pharmacist before using salt substitutes.  Do not fry foods. Cook foods using healthy methods such as baking, boiling, grilling, and broiling instead.  Cook with  heart-healthy oils, such as olive, canola, soybean, or sunflower oil. Meal planning   Eat a balanced diet that includes: ? 5 or more servings of fruits and vegetables each day. At each meal, try to fill half of your plate with fruits and vegetables. ? Up to 6-8 servings of whole grains each day. ? Less than 6 oz of lean meat, poultry, or fish each day. A 3-oz serving of meat is about the same size as a deck of cards. One egg equals 1 oz. ? 2 servings of low-fat dairy each day. ? A serving of nuts, seeds, or beans 5 times each week. ? Heart-healthy fats. Healthy fats called Omega-3 fatty acids are found in foods such as flaxseeds and coldwater fish, like sardines, salmon, and mackerel.  Limit how much you eat of the following: ? Canned or prepackaged foods. ? Food that is high in trans fat, such as fried foods. ? Food that is high in saturated fat, such as fatty meat. ? Sweets, desserts, sugary drinks, and other foods with added sugar. ? Full-fat dairy products.  Do not salt foods before eating.  Try to eat at least 2 vegetarian meals each week.  Eat more home-cooked food and less restaurant, buffet, and fast food.  When eating at a restaurant, ask that your food be prepared with less salt or no salt, if possible. What foods are recommended? The items listed may not be a complete list. Talk with your dietitian about what   dietary choices are best for you. Grains Whole-grain or whole-wheat bread. Whole-grain or whole-wheat pasta. Brown rice. Oatmeal. Quinoa. Bulgur. Whole-grain and low-sodium cereals. Pita bread. Low-fat, low-sodium crackers. Whole-wheat flour tortillas. Vegetables Fresh or frozen vegetables (raw, steamed, roasted, or grilled). Low-sodium or reduced-sodium tomato and vegetable juice. Low-sodium or reduced-sodium tomato sauce and tomato paste. Low-sodium or reduced-sodium canned vegetables. Fruits All fresh, dried, or frozen fruit. Canned fruit in natural juice (without  added sugar). Meat and other protein foods Skinless chicken or turkey. Ground chicken or turkey. Pork with fat trimmed off. Fish and seafood. Egg whites. Dried beans, peas, or lentils. Unsalted nuts, nut butters, and seeds. Unsalted canned beans. Lean cuts of beef with fat trimmed off. Low-sodium, lean deli meat. Dairy Low-fat (1%) or fat-free (skim) milk. Fat-free, low-fat, or reduced-fat cheeses. Nonfat, low-sodium ricotta or cottage cheese. Low-fat or nonfat yogurt. Low-fat, low-sodium cheese. Fats and oils Soft margarine without trans fats. Vegetable oil. Low-fat, reduced-fat, or light mayonnaise and salad dressings (reduced-sodium). Canola, safflower, olive, soybean, and sunflower oils. Avocado. Seasoning and other foods Herbs. Spices. Seasoning mixes without salt. Unsalted popcorn and pretzels. Fat-free sweets. What foods are not recommended? The items listed may not be a complete list. Talk with your dietitian about what dietary choices are best for you. Grains Baked goods made with fat, such as croissants, muffins, or some breads. Dry pasta or rice meal packs. Vegetables Creamed or fried vegetables. Vegetables in a cheese sauce. Regular canned vegetables (not low-sodium or reduced-sodium). Regular canned tomato sauce and paste (not low-sodium or reduced-sodium). Regular tomato and vegetable juice (not low-sodium or reduced-sodium). Pickles. Olives. Fruits Canned fruit in a light or heavy syrup. Fried fruit. Fruit in cream or butter sauce. Meat and other protein foods Fatty cuts of meat. Ribs. Fried meat. Bacon. Sausage. Bologna and other processed lunch meats. Salami. Fatback. Hotdogs. Bratwurst. Salted nuts and seeds. Canned beans with added salt. Canned or smoked fish. Whole eggs or egg yolks. Chicken or turkey with skin. Dairy Whole or 2% milk, cream, and half-and-half. Whole or full-fat cream cheese. Whole-fat or sweetened yogurt. Full-fat cheese. Nondairy creamers. Whipped toppings.  Processed cheese and cheese spreads. Fats and oils Butter. Stick margarine. Lard. Shortening. Ghee. Bacon fat. Tropical oils, such as coconut, palm kernel, or palm oil. Seasoning and other foods Salted popcorn and pretzels. Onion salt, garlic salt, seasoned salt, table salt, and sea salt. Worcestershire sauce. Tartar sauce. Barbecue sauce. Teriyaki sauce. Soy sauce, including reduced-sodium. Steak sauce. Canned and packaged gravies. Fish sauce. Oyster sauce. Cocktail sauce. Horseradish that you find on the shelf. Ketchup. Mustard. Meat flavorings and tenderizers. Bouillon cubes. Hot sauce and Tabasco sauce. Premade or packaged marinades. Premade or packaged taco seasonings. Relishes. Regular salad dressings. Where to find more information:  National Heart, Lung, and Blood Institute: www.nhlbi.nih.gov  American Heart Association: www.heart.org Summary  The DASH eating plan is a healthy eating plan that has been shown to reduce high blood pressure (hypertension). It may also reduce your risk for type 2 diabetes, heart disease, and stroke.  With the DASH eating plan, you should limit salt (sodium) intake to 2,300 mg a day. If you have hypertension, you may need to reduce your sodium intake to 1,500 mg a day.  When on the DASH eating plan, aim to eat more fresh fruits and vegetables, whole grains, lean proteins, low-fat dairy, and heart-healthy fats.  Work with your health care provider or diet and nutrition specialist (dietitian) to adjust your eating plan to your individual   calorie needs. This information is not intended to replace advice given to you by your health care provider. Make sure you discuss any questions you have with your health care provider. Document Released: 04/25/2011 Document Revised: 04/29/2016 Document Reviewed: 04/29/2016 Elsevier Interactive Patient Education  2018 Elsevier Inc.  Health Maintenance, Male A healthy lifestyle and preventive care is important for your  health and wellness. Ask your health care provider about what schedule of regular examinations is right for you. What should I know about weight and diet? Eat a Healthy Diet  Eat plenty of vegetables, fruits, whole grains, low-fat dairy products, and lean protein.  Do not eat a lot of foods high in solid fats, added sugars, or salt.  Maintain a Healthy Weight Regular exercise can help you achieve or maintain a healthy weight. You should:  Do at least 150 minutes of exercise each week. The exercise should increase your heart rate and make you sweat (moderate-intensity exercise).  Do strength-training exercises at least twice a week.  Watch Your Levels of Cholesterol and Blood Lipids  Have your blood tested for lipids and cholesterol every 5 years starting at 59 years of age. If you are at high risk for heart disease, you should start having your blood tested when you are 59 years old. You may need to have your cholesterol levels checked more often if: ? Your lipid or cholesterol levels are high. ? You are older than 59 years of age. ? You are at high risk for heart disease.  What should I know about cancer screening? Many types of cancers can be detected early and may often be prevented. Lung Cancer  You should be screened every year for lung cancer if: ? You are a current smoker who has smoked for at least 30 years. ? You are a former smoker who has quit within the past 15 years.  Talk to your health care provider about your screening options, when you should start screening, and how often you should be screened.  Colorectal Cancer  Routine colorectal cancer screening usually begins at 59 years of age and should be repeated every 5-10 years until you are 59 years old. You may need to be screened more often if early forms of precancerous polyps or small growths are found. Your health care provider may recommend screening at an earlier age if you have risk factors for colon  cancer.  Your health care provider may recommend using home test kits to check for hidden blood in the stool.  A small camera at the end of a tube can be used to examine your colon (sigmoidoscopy or colonoscopy). This checks for the earliest forms of colorectal cancer.  Prostate and Testicular Cancer  Depending on your age and overall health, your health care provider may do certain tests to screen for prostate and testicular cancer.  Talk to your health care provider about any symptoms or concerns you have about testicular or prostate cancer.  Skin Cancer  Check your skin from head to toe regularly.  Tell your health care provider about any new moles or changes in moles, especially if: ? There is a change in a mole's size, shape, or color. ? You have a mole that is larger than a pencil eraser.  Always use sunscreen. Apply sunscreen liberally and repeat throughout the day.  Protect yourself by wearing long sleeves, pants, a wide-brimmed hat, and sunglasses when outside.  What should I know about heart disease, diabetes, and high blood pressure?    If you are 18-39 years of age, have your blood pressure checked every 3-5 years. If you are 40 years of age or older, have your blood pressure checked every year. You should have your blood pressure measured twice-once when you are at a hospital or clinic, and once when you are not at a hospital or clinic. Record the average of the two measurements. To check your blood pressure when you are not at a hospital or clinic, you can use: ? An automated blood pressure machine at a pharmacy. ? A home blood pressure monitor.  Talk to your health care provider about your target blood pressure.  If you are between 45-79 years old, ask your health care provider if you should take aspirin to prevent heart disease.  Have regular diabetes screenings by checking your fasting blood sugar level. ? If you are at a normal weight and have a low risk for  diabetes, have this test once every three years after the age of 45. ? If you are overweight and have a high risk for diabetes, consider being tested at a younger age or more often.  A one-time screening for abdominal aortic aneurysm (AAA) by ultrasound is recommended for men aged 65-75 years who are current or former smokers. What should I know about preventing infection? Hepatitis B If you have a higher risk for hepatitis B, you should be screened for this virus. Talk with your health care provider to find out if you are at risk for hepatitis B infection. Hepatitis C Blood testing is recommended for:  Everyone born from 1945 through 1965.  Anyone with known risk factors for hepatitis C.  Sexually Transmitted Diseases (STDs)  You should be screened each year for STDs including gonorrhea and chlamydia if: ? You are sexually active and are younger than 59 years of age. ? You are older than 59 years of age and your health care provider tells you that you are at risk for this type of infection. ? Your sexual activity has changed since you were last screened and you are at an increased risk for chlamydia or gonorrhea. Ask your health care provider if you are at risk.  Talk with your health care provider about whether you are at high risk of being infected with HIV. Your health care provider may recommend a prescription medicine to help prevent HIV infection.  What else can I do?  Schedule regular health, dental, and eye exams.  Stay current with your vaccines (immunizations).  Do not use any tobacco products, such as cigarettes, chewing tobacco, and e-cigarettes. If you need help quitting, ask your health care provider.  Limit alcohol intake to no more than 2 drinks per day. One drink equals 12 ounces of beer, 5 ounces of wine, or 1 ounces of hard liquor.  Do not use street drugs.  Do not share needles.  Ask your health care provider for help if you need support or information about  quitting drugs.  Tell your health care provider if you often feel depressed.  Tell your health care provider if you have ever been abused or do not feel safe at home. This information is not intended to replace advice given to you by your health care provider. Make sure you discuss any questions you have with your health care provider. Document Released: 11/02/2007 Document Revised: 01/03/2016 Document Reviewed: 02/07/2015 Elsevier Interactive Patient Education  2018 Elsevier Inc.  

## 2018-02-11 LAB — CBC WITH DIFFERENTIAL/PLATELET
BASOS: 1 %
Basophils Absolute: 0.1 10*3/uL (ref 0.0–0.2)
EOS (ABSOLUTE): 0.1 10*3/uL (ref 0.0–0.4)
Eos: 1 %
HEMOGLOBIN: 14.2 g/dL (ref 13.0–17.7)
Hematocrit: 42.1 % (ref 37.5–51.0)
Immature Grans (Abs): 0 10*3/uL (ref 0.0–0.1)
Immature Granulocytes: 0 %
Lymphocytes Absolute: 3.5 10*3/uL — ABNORMAL HIGH (ref 0.7–3.1)
Lymphs: 40 %
MCH: 29.6 pg (ref 26.6–33.0)
MCHC: 33.7 g/dL (ref 31.5–35.7)
MCV: 88 fL (ref 79–97)
MONOS ABS: 0.6 10*3/uL (ref 0.1–0.9)
Monocytes: 7 %
NEUTROS ABS: 4.5 10*3/uL (ref 1.4–7.0)
NEUTROS PCT: 51 %
Platelets: 334 10*3/uL (ref 150–450)
RBC: 4.79 x10E6/uL (ref 4.14–5.80)
RDW: 13.9 % (ref 12.3–15.4)
WBC: 8.8 10*3/uL (ref 3.4–10.8)

## 2018-02-11 LAB — PSA, TOTAL AND FREE
PROSTATE SPECIFIC AG, SERUM: 3.2 ng/mL (ref 0.0–4.0)
PSA, Free Pct: 13.8 %
PSA, Free: 0.44 ng/mL

## 2018-02-11 LAB — TSH: TSH: 1.9 u[IU]/mL (ref 0.450–4.500)

## 2018-02-18 ENCOUNTER — Telehealth: Payer: Self-pay | Admitting: Family Medicine

## 2018-02-18 ENCOUNTER — Other Ambulatory Visit: Payer: Self-pay | Admitting: Family Medicine

## 2018-02-18 DIAGNOSIS — E782 Mixed hyperlipidemia: Secondary | ICD-10-CM

## 2018-02-18 DIAGNOSIS — E559 Vitamin D deficiency, unspecified: Secondary | ICD-10-CM | POA: Diagnosis not present

## 2018-02-18 DIAGNOSIS — Z008 Encounter for other general examination: Secondary | ICD-10-CM | POA: Diagnosis not present

## 2018-02-18 DIAGNOSIS — R7989 Other specified abnormal findings of blood chemistry: Secondary | ICD-10-CM

## 2018-02-18 DIAGNOSIS — E039 Hypothyroidism, unspecified: Secondary | ICD-10-CM | POA: Diagnosis not present

## 2018-02-18 DIAGNOSIS — Z719 Counseling, unspecified: Secondary | ICD-10-CM | POA: Diagnosis not present

## 2018-02-18 DIAGNOSIS — R7303 Prediabetes: Secondary | ICD-10-CM

## 2018-02-18 MED ORDER — VITAMIN D 600 IU CAPSULE SWOG S0812: 600.0000 [IU] | ORAL_CAPSULE | Freq: Every day | ORAL | 6 refills | Status: DC

## 2018-02-18 MED ORDER — VITAMIN D 1000 UNITS PO TABS: 1000.0000 [IU] | ORAL_TABLET | Freq: Every day | ORAL | 3 refills | Status: DC

## 2018-02-18 MED ORDER — ICOSAPENT ETHYL 1 G PO CAPS: 2.0000 g | ORAL_CAPSULE | Freq: Two times a day (BID) | ORAL | 3 refills | Status: DC

## 2018-02-18 NOTE — Telephone Encounter (Signed)
Lab work received from pts place of employment:  Cholesterol 165 HDL 35 Triglycerides 200 LDL 98 A1C 5.7 Vit D, 25-OH, total 28  Pt prediabetic. Pt needs to monitor diet, watch carb intake. Avoid sugary beverages and snacks. Diabetic diet discussed.   Pt to restart Vascepa and to start vitamin D. Medications sent to pharmacy.  Attempted to contact pt via phone call, no answer, message left.

## 2018-02-18 NOTE — Progress Notes (Signed)
Lab work received from pts place of employment:  Cholesterol 165 HDL 35 Triglycerides 200 LDL 98 A1C 5.7 Vit D, 25-OH, total 28  Pt prediabetic. Pt to monitor diet, watch carb intake. Avoid sugary beverages and snacks. Diabetic diet discussed.   Pt to restart Vascepa and to start vitamin D. Medications sent to pharmacy and pt will be notified via phone call.

## 2018-06-08 DIAGNOSIS — Z008 Encounter for other general examination: Secondary | ICD-10-CM | POA: Diagnosis not present

## 2018-06-08 DIAGNOSIS — E782 Mixed hyperlipidemia: Secondary | ICD-10-CM | POA: Diagnosis not present

## 2018-06-08 DIAGNOSIS — E559 Vitamin D deficiency, unspecified: Secondary | ICD-10-CM | POA: Diagnosis not present

## 2018-06-08 DIAGNOSIS — E039 Hypothyroidism, unspecified: Secondary | ICD-10-CM | POA: Diagnosis not present

## 2018-07-22 DIAGNOSIS — E559 Vitamin D deficiency, unspecified: Secondary | ICD-10-CM | POA: Diagnosis not present

## 2018-07-22 DIAGNOSIS — E039 Hypothyroidism, unspecified: Secondary | ICD-10-CM | POA: Diagnosis not present

## 2018-07-22 DIAGNOSIS — Z0130 Encounter for examination of blood pressure without abnormal findings: Secondary | ICD-10-CM | POA: Diagnosis not present

## 2018-07-22 DIAGNOSIS — Z139 Encounter for screening, unspecified: Secondary | ICD-10-CM | POA: Diagnosis not present

## 2018-07-22 DIAGNOSIS — E782 Mixed hyperlipidemia: Secondary | ICD-10-CM | POA: Diagnosis not present

## 2018-07-22 DIAGNOSIS — R7309 Other abnormal glucose: Secondary | ICD-10-CM | POA: Diagnosis not present

## 2018-07-22 DIAGNOSIS — Z79899 Other long term (current) drug therapy: Secondary | ICD-10-CM | POA: Diagnosis not present

## 2018-08-05 DIAGNOSIS — Z7189 Other specified counseling: Secondary | ICD-10-CM | POA: Diagnosis not present

## 2018-08-05 DIAGNOSIS — E782 Mixed hyperlipidemia: Secondary | ICD-10-CM | POA: Diagnosis not present

## 2018-08-05 DIAGNOSIS — E559 Vitamin D deficiency, unspecified: Secondary | ICD-10-CM | POA: Diagnosis not present

## 2018-08-05 DIAGNOSIS — E039 Hypothyroidism, unspecified: Secondary | ICD-10-CM | POA: Diagnosis not present

## 2018-09-08 ENCOUNTER — Other Ambulatory Visit: Payer: Self-pay

## 2018-09-08 ENCOUNTER — Ambulatory Visit: Payer: BLUE CROSS/BLUE SHIELD | Admitting: Family Medicine

## 2018-09-08 ENCOUNTER — Ambulatory Visit (HOSPITAL_COMMUNITY)
Admission: RE | Admit: 2018-09-08 | Discharge: 2018-09-08 | Disposition: A | Discharge status: Home / Self Care | Payer: BLUE CROSS/BLUE SHIELD | Source: Ambulatory Visit | Attending: Family Medicine | Admitting: Family Medicine

## 2018-09-08 ENCOUNTER — Encounter: Payer: Self-pay | Admitting: Family Medicine

## 2018-09-08 VITALS — BP 153/87 | HR 97 | Temp 98.1°F | Ht 69.0 in | Wt 187.0 lb

## 2018-09-08 DIAGNOSIS — R110 Nausea: Secondary | ICD-10-CM | POA: Diagnosis not present

## 2018-09-08 DIAGNOSIS — R1011 Right upper quadrant pain: Secondary | ICD-10-CM | POA: Diagnosis not present

## 2018-09-08 DIAGNOSIS — K8020 Calculus of gallbladder without cholecystitis without obstruction: Secondary | ICD-10-CM | POA: Diagnosis not present

## 2018-09-08 MED ORDER — ONDANSETRON HCL 4 MG PO TABS: 4.0000 mg | ORAL_TABLET | Freq: Three times a day (TID) | ORAL | 0 refills | Status: DC | PRN

## 2018-09-08 NOTE — Patient Instructions (Signed)

## 2018-09-08 NOTE — Progress Notes (Signed)
Subjective:  Patient ID: Carl Barton, male    DOB: 1961-01-16, 58 y.o.   MRN: [id number]  Chief Complaint:  Abdominal Pain   HPI: [name] is a 58 y.o. male presenting on 09/08/2018 for Abdominal Pain  Pt presents today with complaints of RUQ abdominal pain and nausea. He states this started about 10 days ago and has been really bad over the last 4 nights. Pt states the symptoms are worse after a fatty or greasy meal. States the pain is 7/10 at worst, cramping to sharp in nature. He denies vomiting, fever, chills, weakness, confusion, constipation, or diarrhea. Denies radiation of pain. He has not tired anything for the pain. States he has had this intermittently in the past but not this significant.   Relevant past medical, surgical, family, and social history reviewed and updated as indicated.  Allergies and medications reviewed and updated.   Past Medical History:  Diagnosis Date  . Chicken pox   . Hyperlipidemia   . Thyroid disease     Past Surgical History:  Procedure Laterality Date  . never      Social History   Socioeconomic History  . Marital status: Married    Spouse name: Not on file  . Number of children: Not on file  . Years of education: Not on file  . Highest education level: Not on file  Occupational History  . Not on file  Social Needs  . Financial resource strain: Not on file  . Food insecurity:    Worry: Not on file    Inability: Not on file  . Transportation needs:    Medical: Not on file    Non-medical: Not on file  Tobacco Use  . Smoking status: Former Smoker    Packs/day: 1.00    Years: 15.00    Pack years: 15.00    Types: Cigarettes    Last attempt to quit: 05/20/1997    Years since quitting: 21.3  . Smokeless tobacco: Never Used  Substance and Sexual Activity  . Alcohol use: No    Alcohol/week: 0.0 standard drinks  . Drug use: No  . Sexual activity: Yes  Lifestyle  . Physical activity:    Days per week: Not  on file    Minutes per session: Not on file  . Stress: Not on file  Relationships  . Social connections:    Talks on phone: Not on file    Gets together: Not on file    Attends religious service: Not on file    Active member of club or organization: Not on file    Attends meetings of clubs or organizations: Not on file    Relationship status: Not on file  . Intimate partner violence:    Fear of current or ex partner: Not on file    Emotionally abused: Not on file    Physically abused: Not on file    Forced sexual activity: Not on file  Other Topics Concern  . Not on file  Social History Narrative  . Not on file    Outpatient Encounter Medications as of 09/08/2018  Medication Sig  . cholecalciferol (VITAMIN D) 1000 units tablet Take 1 tablet (1,000 Units total) by mouth daily.  Marland Kitchen levothyroxine (SYNTHROID, LEVOTHROID) 100 MCG tablet Take 100 mcg by mouth daily before breakfast.  . Icosapent Ethyl (VASCEPA) 1 g CAPS Take 2 capsules (2 g total) by mouth 2 (two) times daily.  . ondansetron (ZOFRAN) 4 MG tablet Take  1 tablet (4 mg total) by mouth every 8 (eight) hours as needed for nausea or vomiting.   No facility-administered encounter medications on file as of 09/08/2018.     No Known Allergies  Review of Systems  Constitutional: Positive for appetite change. Negative for activity change, chills, fatigue and fever.  Respiratory: Negative for cough, chest tightness and shortness of breath.   Cardiovascular: Negative for chest pain, palpitations and leg swelling.  Gastrointestinal: Positive for abdominal pain (RUQ) and nausea. Negative for abdominal distention, anal bleeding, blood in stool, constipation, diarrhea, rectal pain and vomiting.  Genitourinary: Negative for decreased urine volume, difficulty urinating, flank pain, frequency and urgency.  Musculoskeletal: Negative for arthralgias and myalgias.  Neurological: Negative for dizziness, weakness and headaches.   Psychiatric/Behavioral: Negative for confusion.  All other systems reviewed and are negative.       Objective:  BP (!) 153/87   Pulse 97   Temp 98.1 F (36.7 C)   Ht 5\' 9"  (1.753 m)   Wt 187 lb (84.8 kg)   BMI 27.62 kg/m    Wt Readings from Last 3 Encounters:  09/08/18 187 lb (84.8 kg)  02/10/18 188 lb (85.3 kg)  11/01/16 188 lb (85.3 kg)    Physical Exam Vitals signs and nursing note reviewed.  Constitutional:      General: He is not in acute distress.    Appearance: Normal appearance. He is well-developed. He is not ill-appearing or toxic-appearing.  HENT:     Head: Normocephalic and atraumatic.     Mouth/Throat:     Mouth: Mucous membranes are moist.     Pharynx: Oropharynx is clear.  Eyes:     Conjunctiva/sclera: Conjunctivae normal.     Pupils: Pupils are equal, round, and reactive to light.  Neck:     Musculoskeletal: Neck supple.  Cardiovascular:     Rate and Rhythm: Normal rate and regular rhythm.     Heart sounds: No murmur. No friction rub. No gallop.   Pulmonary:     Effort: Pulmonary effort is normal. No respiratory distress.     Breath sounds: Normal breath sounds.  Abdominal:     General: Bowel sounds are normal. There is no distension.     Palpations: Abdomen is soft. There is no hepatomegaly, splenomegaly or mass.     Tenderness: There is abdominal tenderness in the right upper quadrant. There is guarding. There is no right CVA tenderness, left CVA tenderness or rebound. Negative signs include Murphy's sign and McBurney's sign.     Hernia: No hernia is present.  Skin:    General: Skin is warm and dry.     Capillary Refill: Capillary refill takes less than 2 seconds.  Neurological:     General: No focal deficit present.     Mental Status: He is alert and oriented to person, place, and time.  Psychiatric:        Mood and Affect: Mood normal.        Behavior: Behavior normal.        Thought Content: Thought content normal.        Judgment:  Judgment normal.     Results for orders placed or performed in visit on 02/10/18  PSA, total and free  Result Value Ref Range   Prostate Specific Ag, Serum 3.2 0.0 - 4.0 ng/mL   PSA, Free 0.44 N/A ng/mL   PSA, Free Pct 13.8 %  CBC with Differential/Platelet  Result Value Ref Range   WBC 8.8 3.4 -  10.8 x10E3/uL   RBC 4.79 4.14 - 5.80 x10E6/uL   Hemoglobin 14.2 13.0 - 17.7 g/dL   Hematocrit 16.1 09.6 - 51.0 %   MCV 88 79 - 97 fL   MCH 29.6 26.6 - 33.0 pg   MCHC 33.7 31.5 - 35.7 g/dL   RDW 04.5 40.9 - 81.1 %   Platelets 334 150 - 450 x10E3/uL   Neutrophils 51 Not Estab. %   Lymphs 40 Not Estab. %   Monocytes 7 Not Estab. %   Eos 1 Not Estab. %   Basos 1 Not Estab. %   Neutrophils Absolute 4.5 1.4 - 7.0 x10E3/uL   Lymphocytes Absolute 3.5 (H) 0.7 - 3.1 x10E3/uL   Monocytes Absolute 0.6 0.1 - 0.9 x10E3/uL   EOS (ABSOLUTE) 0.1 0.0 - 0.4 x10E3/uL   Basophils Absolute 0.1 0.0 - 0.2 x10E3/uL   Immature Granulocytes 0 Not Estab. %   Immature Grans (Abs) 0.0 0.0 - 0.1 x10E3/uL  TSH  Result Value Ref Range   TSH 1.900 0.450 - 4.500 uIU/mL       Pertinent labs & imaging results that were available during my care of the patient were reviewed by me and considered in my medical decision making.  Assessment & Plan:  Jeramia was seen today for abdominal pain.  Diagnoses and all orders for this visit:  RUQ pain Signs and symptoms consistent with cholelithiasis. Will get labs and RUQ [place]. Report any new or worsening symptoms. Will refer to general surgery if [place] warrants referral.  -     CBC with Differential/Platelet -     Lipase -     Amylase -     US Abdomen Limited RUQ; Future  Nausea Labs pending. Bland diet. Medications as prescribed. Report any new or worsening symptoms.  -     CBC with Differential/Platelet -     Lipase -     Amylase -     US Abdomen Limited RUQ; Future -     ondansetron (ZOFRAN) 4 MG tablet; Take 1 tablet (4 mg total) by mouth every 8 (eight) hours as  needed for nausea or vomiting.     Continue all other maintenance medications.  Follow up plan: Return in about 2 weeks (around 09/22/2018), or if symptoms worsen or fail to improve, for abdominal pain.  Educational handout given for abdominal pain  The above assessment and management plan was discussed with the patient. The patient verbalized understanding of and has agreed to the management plan. Patient is aware to call the clinic if symptoms persist or worsen. Patient is aware when to return to the clinic for a follow-up visit. Patient educated on when it is appropriate to go to the emergency department.   Kari Baars, FNP-C [facility] [phone]

## 2018-09-09 LAB — CBC WITH DIFFERENTIAL/PLATELET
Basophils Absolute: 0.1 10*3/uL (ref 0.0–0.2)
Basos: 1 %
EOS (ABSOLUTE): 0 10*3/uL (ref 0.0–0.4)
Eos: 0 %
Hematocrit: 45.1 % (ref 37.5–51.0)
Hemoglobin: 15 g/dL (ref 13.0–17.7)
Immature Grans (Abs): 0 10*3/uL (ref 0.0–0.1)
Immature Granulocytes: 0 %
Lymphocytes Absolute: 3.1 10*3/uL (ref 0.7–3.1)
Lymphs: 30 %
MCH: 28.2 pg (ref 26.6–33.0)
MCHC: 33.3 g/dL (ref 31.5–35.7)
MCV: 85 fL (ref 79–97)
Monocytes Absolute: 0.6 10*3/uL (ref 0.1–0.9)
Monocytes: 5 %
Neutrophils Absolute: 6.7 10*3/uL (ref 1.4–7.0)
Neutrophils: 64 %
Platelets: 356 10*3/uL (ref 150–450)
RBC: 5.31 x10E6/uL (ref 4.14–5.80)
RDW: 13.2 % (ref 11.6–15.4)
WBC: 10.5 10*3/uL (ref 3.4–10.8)

## 2018-09-09 LAB — AMYLASE: Amylase: 78 U/L (ref 31–110)

## 2018-09-09 LAB — LIPASE: Lipase: 37 U/L (ref 13–78)

## 2018-09-24 ENCOUNTER — Encounter: Payer: Self-pay | Admitting: General Surgery

## 2018-09-24 ENCOUNTER — Ambulatory Visit: Payer: BLUE CROSS/BLUE SHIELD | Admitting: General Surgery

## 2018-09-24 ENCOUNTER — Other Ambulatory Visit: Payer: Self-pay

## 2018-09-24 VITALS — BP 150/88 | HR 75 | Temp 97.8°F | Resp 16 | Wt 186.0 lb

## 2018-09-24 DIAGNOSIS — K8020 Calculus of gallbladder without cholecystitis without obstruction: Secondary | ICD-10-CM | POA: Diagnosis not present

## 2018-09-24 NOTE — Progress Notes (Signed)
Carl Barton; [id number]; 05/12/1961   HPI Patient is a 57-year-old white male who was referred to my care by Linda Rakes for evaluation treatment of cholelithiasis.  Patient has had worsening right upper quadrant abdominal pain, nausea, and fatty food intolerance for the past few weeks.  Is becoming more frequent in nature.  His pain is currently 2 out of 10.  It becomes severe when he has an attack.  He denies any fever, chills, or jaundice.  Ultrasound the gallbladder reveals cholelithiasis.  Common bile duct is within normal limits. Past Medical History:  Diagnosis Date  . Chicken pox   . Hyperlipidemia   . Thyroid disease     Past Surgical History:  Procedure Laterality Date  . never      Family History  Problem Relation Age of Onset  . Arthritis Mother   . Cancer Father        bone  . Cancer Maternal Grandfather   . Asthma Paternal Grandmother   . Diabetes Paternal Grandfather     Current Outpatient Medications on File Prior to Visit  Medication Sig Dispense Refill  . cholecalciferol (VITAMIN D) 1000 units tablet Take 1 tablet (1,000 Units total) by mouth daily. 90 tablet 3  . levothyroxine (SYNTHROID, LEVOTHROID) 100 MCG tablet Take 100 mcg by mouth daily before breakfast.    . Icosapent Ethyl (VASCEPA) 1 g CAPS Take 2 capsules (2 g total) by mouth 2 (two) times daily. 120 capsule 3  . ondansetron (ZOFRAN) 4 MG tablet Take 1 tablet (4 mg total) by mouth every 8 (eight) hours as needed for nausea or vomiting. (Patient not taking: Reported on 09/24/2018) 20 tablet 0   No current facility-administered medications on file prior to visit.     No Known Allergies  Social History   Substance and Sexual Activity  Alcohol Use No  . Alcohol/week: 0.0 standard drinks    Social History   Tobacco Use  Smoking Status Former Smoker  . Packs/day: 1.00  . Years: 15.00  . Pack years: 15.00  . Types: Cigarettes  . Last attempt to quit: 05/20/1997  . Years since quitting:  21.3  Smokeless Tobacco Never Used    Review of Systems  Constitutional: Negative.   HENT: Negative.   Eyes: Negative.   Respiratory: Negative.   Cardiovascular: Negative.   Gastrointestinal: Positive for abdominal pain.  Genitourinary: Negative.   Musculoskeletal: Negative.   Skin: Negative.   Neurological: Negative.   Endo/Heme/Allergies: Negative.   Psychiatric/Behavioral: Negative.     Objective   Vitals:   09/24/18 1027  BP: (!) 150/88  Pulse: 75  Resp: 16  Temp: 97.8 F (36.6 C)  SpO2: 97%    Physical Exam Vitals signs reviewed.  Constitutional:      Appearance: Normal appearance. He is not ill-appearing.  HENT:     Head: Normocephalic and atraumatic.  Eyes:     General: No scleral icterus. Cardiovascular:     Rate and Rhythm: Normal rate and regular rhythm.     Heart sounds: Normal heart sounds. No murmur. No friction rub. No gallop.   Pulmonary:     Effort: Pulmonary effort is normal. No respiratory distress.     Breath sounds: Normal breath sounds. No stridor. No wheezing, rhonchi or rales.  Abdominal:     General: Abdomen is flat. Bowel sounds are normal. There is no distension.     Palpations: Abdomen is soft. There is no mass.     Tenderness: There is abdominal   tenderness. There is no guarding or rebound.     Hernia: No hernia is present.     Comments: Mild discomfort to deep palpation in right upper quadrant.  Skin:    General: Skin is warm and dry.  Neurological:     Mental Status: He is alert and oriented to person, place, and time.   PCP notes reviewed.  Assessment  Biliary colic, cholelithiasis Plan   Scheduled for laparoscopic cholecystectomy on 10/07/18.  The risks and benefits of the procedure including bleeding, infection, hepatobiliary injury, and the possibility of an open procedure were fully explained to the patient, who gives informed consent.  Preoperative testing including Covid-19 will be performed.  

## 2018-09-24 NOTE — H&P (Signed)
Carl Barton; [id number]; January 11, 1961   HPI Patient is a 58 year old white male who was referred to my care by Gilford SilviusLinda Rakes for evaluation treatment of cholelithiasis.  Patient has had worsening right upper quadrant abdominal pain, nausea, and fatty food intolerance for the past few weeks.  Is becoming more frequent in nature.  His pain is currently 2 out of 10.  It becomes severe when he has an attack.  He denies any fever, chills, or jaundice.  Ultrasound the gallbladder reveals cholelithiasis.  Common bile duct is within normal limits. Past Medical History:  Diagnosis Date  . Chicken pox   . Hyperlipidemia   . Thyroid disease     Past Surgical History:  Procedure Laterality Date  . never      Family History  Problem Relation Age of Onset  . Arthritis Mother   . Cancer Father        bone  . Cancer Maternal Grandfather   . Asthma Paternal Grandmother   . Diabetes Paternal Grandfather     Current Outpatient Medications on File Prior to Visit  Medication Sig Dispense Refill  . cholecalciferol (VITAMIN D) 1000 units tablet Take 1 tablet (1,000 Units total) by mouth daily. 90 tablet 3  . levothyroxine (SYNTHROID, LEVOTHROID) 100 MCG tablet Take 100 mcg by mouth daily before breakfast.    . Icosapent Ethyl (VASCEPA) 1 g CAPS Take 2 capsules (2 g total) by mouth 2 (two) times daily. 120 capsule 3  . ondansetron (ZOFRAN) 4 MG tablet Take 1 tablet (4 mg total) by mouth every 8 (eight) hours as needed for nausea or vomiting. (Patient not taking: Reported on 09/24/2018) 20 tablet 0   No current facility-administered medications on file prior to visit.     No Known Allergies  Social History   Substance and Sexual Activity  Alcohol Use No  . Alcohol/week: 0.0 standard drinks    Social History   Tobacco Use  Smoking Status Former Smoker  . Packs/day: 1.00  . Years: 15.00  . Pack years: 15.00  . Types: Cigarettes  . Last attempt to quit: 05/20/1997  . Years since quitting:  21.3  Smokeless Tobacco Never Used    Review of Systems  Constitutional: Negative.   HENT: Negative.   Eyes: Negative.   Respiratory: Negative.   Cardiovascular: Negative.   Gastrointestinal: Positive for abdominal pain.  Genitourinary: Negative.   Musculoskeletal: Negative.   Skin: Negative.   Neurological: Negative.   Endo/Heme/Allergies: Negative.   Psychiatric/Behavioral: Negative.     Objective   Vitals:   09/24/18 1027  BP: (!) 150/88  Pulse: 75  Resp: 16  Temp: 97.8 F (36.6 C)  SpO2: 97%    Physical Exam Vitals signs reviewed.  Constitutional:      Appearance: Normal appearance. He is not ill-appearing.  HENT:     Head: Normocephalic and atraumatic.  Eyes:     General: No scleral icterus. Cardiovascular:     Rate and Rhythm: Normal rate and regular rhythm.     Heart sounds: Normal heart sounds. No murmur. No friction rub. No gallop.   Pulmonary:     Effort: Pulmonary effort is normal. No respiratory distress.     Breath sounds: Normal breath sounds. No stridor. No wheezing, rhonchi or rales.  Abdominal:     General: Abdomen is flat. Bowel sounds are normal. There is no distension.     Palpations: Abdomen is soft. There is no mass.     Tenderness: There is abdominal  tenderness. There is no guarding or rebound.     Hernia: No hernia is present.     Comments: Mild discomfort to deep palpation in right upper quadrant.  Skin:    General: Skin is warm and dry.  Neurological:     Mental Status: He is alert and oriented to person, place, and time.   PCP notes reviewed.  Assessment  Biliary colic, cholelithiasis Plan   Scheduled for laparoscopic cholecystectomy on 10/07/18.  The risks and benefits of the procedure including bleeding, infection, hepatobiliary injury, and the possibility of an open procedure were fully explained to the patient, who gives informed consent.  Preoperative testing including Covid-19 will be performed.

## 2018-09-24 NOTE — Patient Instructions (Signed)
Laparoscopic Cholecystectomy Laparoscopic cholecystectomy is surgery to remove the gallbladder. The gallbladder is a pear-shaped organ that lies beneath the liver on the right side of the body. The gallbladder stores bile, which is a fluid that helps the body to digest fats. Cholecystectomy is often done for inflammation of the gallbladder (cholecystitis). This condition is usually caused by a buildup of gallstones (cholelithiasis) in the gallbladder. Gallstones can block the flow of bile, which can result in inflammation and pain. In severe cases, emergency surgery may be required. This procedure is done though small incisions in your abdomen (laparoscopic surgery). A thin scope with a camera (laparoscope) is inserted through one incision. Thin surgical instruments are inserted through the other incisions. In some cases, a laparoscopic procedure may be turned into a type of surgery that is done through a larger incision (open surgery). Tell a health care provider about:  Any allergies you have.  All medicines you are taking, including vitamins, herbs, eye drops, creams, and over-the-counter medicines.  Any problems you or family members have had with anesthetic medicines.  Any blood disorders you have.  Any surgeries you have had.  Any medical conditions you have.  Whether you are pregnant or may be pregnant. What are the risks? Generally, this is a safe procedure. However, problems may occur, including:  Infection.  Bleeding.  Allergic reactions to medicines.  Damage to other structures or organs.  A stone remaining in the common bile duct. The common bile duct carries bile from the gallbladder into the small intestine.  A bile leak from the cyst duct that is clipped when your gallbladder is removed. What happens before the procedure?   Medicines  Ask your health care provider about: ? Changing or stopping your regular medicines. This is especially important if you are taking  diabetes medicines or blood thinners. ? Taking medicines such as aspirin and ibuprofen. These medicines can thin your blood. Do not take these medicines before your procedure if your health care provider instructs you not to.  You may be given antibiotic medicine to help prevent infection. General instructions  Let your health care provider know if you develop a cold or an infection before surgery.  Plan to have someone take you home from the hospital or clinic.  Ask your health care provider how your surgical site will be marked or identified. What happens during the procedure?   To reduce your risk of infection: ? Your health care team will wash or sanitize their hands. ? Your skin will be washed with soap. ? Hair may be removed from the surgical area.  An IV tube may be inserted into one of your veins.  You will be given one or more of the following: ? A medicine to help you relax (sedative). ? A medicine to make you fall asleep (general anesthetic).  A breathing tube will be placed in your mouth.  Your surgeon will make several small cuts (incisions) in your abdomen.  The laparoscope will be inserted through one of the small incisions. The camera on the laparoscope will send images to a TV screen (monitor) in the operating room. This lets your surgeon see inside your abdomen.  Air-like gas will be pumped into your abdomen. This will expand your abdomen to give the surgeon more room to perform the surgery.  Other tools that are needed for the procedure will be inserted through the other incisions. The gallbladder will be removed through one of the incisions.  Your common bile duct   may be examined. If stones are found in the common bile duct, they may be removed.  After your gallbladder has been removed, the incisions will be closed with stitches (sutures), staples, or skin glue.  Your incisions may be covered with a bandage (dressing). The procedure may vary among health  care providers and hospitals. What happens after the procedure?  Your blood pressure, heart rate, breathing rate, and blood oxygen level will be monitored until the medicines you were given have worn off.  You will be given medicines as needed to control your pain.  Do not drive for 24 hours if you were given a sedative. This information is not intended to replace advice given to you by your health care provider. Make sure you discuss any questions you have with your health care provider. Document Released: 05/06/2005 Document Revised: 04/03/2017 Document Reviewed: 10/23/2015 Elsevier Interactive Patient Education  2019 Elsevier Inc[employer]  

## 2018-09-29 NOTE — Patient Instructions (Signed)
Carl Barton  09/29/2018     @PREFPERIOPPHARMACY @   Your procedure is scheduled on  10/07/18  [facility]   A.M.  Call this number if you have problems the morning of surgery:  [phone]   Remember:  Do not eat or drink after midnight.                        Take these medicines the morning of surgery with A SIP OF WATER  levothyroxine    Do not wear jewelry, make-up or nail polish.  Do not wear lotions, powders, or perfumes, or deodorant.  Do not shave 48 hours prior to surgery.  Men may shave face and neck.  Do not bring valuables to the hospital.  [facility] is not responsible for any belongings or valuables.  Contacts, dentures or bridgework may not be worn into surgery.  Leave your suitcase in the car.  After surgery it may be brought to your room.  For patients admitted to the hospital, discharge time will be determined by your treatment team.  Patients discharged the day of surgery will not be allowed to drive home.   Name and phone number of your driver:   family Special instructions:  None  Please read over the following fact sheets that you were given. Anesthesia Post-op Instructions and Care and Recovery After Surgery       Laparoscopic Cholecystectomy, Care After This sheet gives you information about how to care for yourself after your procedure. Your health care provider may also give you more specific instructions. If you have problems or questions, contact your health care provider. What can I expect after the procedure? After the procedure, it is common to have:  Pain at your incision sites. You will be given medicines to control this pain.  Mild nausea or vomiting.  Bloating and possible shoulder pain from the air-like gas that was used during the procedure. Follow these instructions at home: Incision care   Follow instructions from your health care provider about how to take care of your incisions. Make  sure you: ? Wash your hands with soap and water before you change your bandage (dressing). If soap and water are not available, use hand sanitizer. ? Change your dressing as told by your health care provider. ? Leave stitches (sutures), skin glue, or adhesive strips in place. These skin closures may need to be in place for 2 weeks or longer. If adhesive strip edges start to loosen and curl up, you may trim the loose edges. Do not remove adhesive strips completely unless your health care provider tells you to do that.  Do not take baths, swim, or use a hot tub until your health care provider approves. Ask your health care provider if you can take showers. You may only be allowed to take sponge baths for bathing.  Check your incision area every day for signs of infection. Check for: ? More redness, swelling, or pain. ? More fluid or blood. ? Warmth. ? Pus or a bad smell. Activity  Do not drive or use heavy machinery while taking prescription pain medicine.  Do not lift anything that is heavier than 10 lb (4.5 kg) until your health care provider approves.  Do not play contact sports until your health care provider approves.  Do not drive for 24 hours if you were given a medicine to help you relax (sedative).  Rest as needed. Do not return to work or school until your health care provider approves. General instructions  Take over-the-counter and prescription medicines only as told by your health care provider.  To prevent or treat constipation while you are taking prescription pain medicine, your health care provider may recommend that you: ? Drink enough fluid to keep your urine clear or pale yellow. ? Take over-the-counter or prescription medicines. ? Eat foods that are high in fiber, such as fresh fruits and vegetables, whole grains, and beans. ? Limit foods that are high in fat and processed sugars, such as fried and sweet foods. Contact a health care provider if:  You develop a  rash.  You have more redness, swelling, or pain around your incisions.  You have more fluid or blood coming from your incisions.  Your incisions feel warm to the touch.  You have pus or a bad smell coming from your incisions.  You have a fever.  One or more of your incisions breaks open. Get help right away if:  You have trouble breathing.  You have chest pain.  You have increasing pain in your shoulders.  You faint or feel dizzy when you stand.  You have severe pain in your abdomen.  You have nausea or vomiting that lasts for more than one day.  You have leg pain. This information is not intended to replace advice given to you by your health care provider. Make sure you discuss any questions you have with your health care provider. Document Released: 05/06/2005 Document Revised: 11/25/2015 Document Reviewed: 10/23/2015 Elsevier Interactive Patient Education  2019 Elsevier Inc[employer] General Anesthesia, Adult, Care After This sheet gives you information about how to care for yourself after your procedure. Your health care provider may also give you more specific instructions. If you have problems or questions, contact your health care provider. What can I expect after the procedure? After the procedure, the following side effects are common:  Pain or discomfort at the IV site.  Nausea.  Vomiting.  Sore throat.  Trouble concentrating.  Feeling cold or chills.  Weak or tired.  Sleepiness and fatigue.  Soreness and body aches. These side effects can affect parts of the body that were not involved in surgery. Follow these instructions at home:  For at least 24 hours after the procedure:  Have a responsible adult stay with you. It is important to have someone help care for you until you are awake and alert.  Rest as needed.  Do not: ? Participate in activities in which you could fall or become injured. ? Drive. ? Use heavy machinery. ? Drink alcohol. ? Take  sleeping pills or medicines that cause drowsiness. ? Make important decisions or sign legal documents. ? Take care of children on your own. Eating and drinking  Follow any instructions from your health care provider about eating or drinking restrictions.  When you feel hungry, start by eating small amounts of foods that are soft and easy to digest (bland), such as toast. Gradually return to your regular diet.  Drink enough fluid to keep your urine pale yellow.  If you vomit, rehydrate by drinking water, juice, or clear broth. General instructions  If you have sleep apnea, surgery and certain medicines can increase your risk for breathing problems. Follow instructions from your health care provider about wearing your sleep device: ? Anytime you are sleeping, including during daytime naps. ? While taking prescription pain medicines, sleeping medicines, or medicines that make you drowsy.  Return to your normal activities as told by your health care provider. Ask your health care provider what activities are safe for you.  Take over-the-counter and prescription medicines only as told by your health care provider.  If you smoke, do not smoke without supervision.  Keep all follow-up visits as told by your health care provider. This is important. Contact a health care provider if:  You have nausea or vomiting that does not get better with medicine.  You cannot eat or drink without vomiting.  You have pain that does not get better with medicine.  You are unable to pass urine.  You develop a skin rash.  You have a fever.  You have redness around your IV site that gets worse. Get help right away if:  You have difficulty breathing.  You have chest pain.  You have blood in your urine or stool, or you vomit blood. Summary  After the procedure, it is common to have a sore throat or nausea. It is also common to feel tired.  Have a responsible adult stay with you for the first 24  hours after general anesthesia. It is important to have someone help care for you until you are awake and alert.  When you feel hungry, start by eating small amounts of foods that are soft and easy to digest (bland), such as toast. Gradually return to your regular diet.  Drink enough fluid to keep your urine pale yellow.  Return to your normal activities as told by your health care provider. Ask your health care provider what activities are safe for you. This information is not intended to replace advice given to you by your health care provider. Make sure you discuss any questions you have with your health care provider. Document Released: 08/12/2000 Document Revised: 12/20/2016 Document Reviewed: 12/20/2016 Elsevier Interactive Patient Education  2019 [employer].

## 2018-10-02 ENCOUNTER — Encounter (HOSPITAL_COMMUNITY)
Admission: RE | Admit: 2018-10-02 | Discharge: 2018-10-02 | Disposition: A | Discharge status: Home / Self Care | Payer: BLUE CROSS/BLUE SHIELD | Source: Ambulatory Visit | Attending: General Surgery | Admitting: General Surgery

## 2018-10-02 ENCOUNTER — Encounter (HOSPITAL_COMMUNITY): Payer: Self-pay

## 2018-10-02 ENCOUNTER — Other Ambulatory Visit: Payer: Self-pay

## 2018-10-02 ENCOUNTER — Other Ambulatory Visit (HOSPITAL_COMMUNITY)
Admission: RE | Admit: 2018-10-02 | Discharge: 2018-10-02 | Disposition: A | Discharge status: Home / Self Care | Payer: BLUE CROSS/BLUE SHIELD | Source: Ambulatory Visit | Attending: General Surgery | Admitting: General Surgery

## 2018-10-02 DIAGNOSIS — Z1159 Encounter for screening for other viral diseases: Secondary | ICD-10-CM | POA: Diagnosis not present

## 2018-10-02 DIAGNOSIS — Z01812 Encounter for preprocedural laboratory examination: Secondary | ICD-10-CM | POA: Diagnosis not present

## 2018-10-02 HISTORY — DX: Hypothyroidism, unspecified: E03.9

## 2018-10-02 LAB — CBC WITH DIFFERENTIAL/PLATELET
Abs Immature Granulocytes: 0.01 10*3/uL (ref 0.00–0.07)
Basophils Absolute: 0.1 10*3/uL (ref 0.0–0.1)
Basophils Relative: 1 %
Eosinophils Absolute: 0.1 10*3/uL (ref 0.0–0.5)
Eosinophils Relative: 1 %
HCT: 44 % (ref 39.0–52.0)
Hemoglobin: 14.1 g/dL (ref 13.0–17.0)
Immature Granulocytes: 0 %
Lymphocytes Relative: 45 %
Lymphs Abs: 3.1 10*3/uL (ref 0.7–4.0)
MCH: 28.8 pg (ref 26.0–34.0)
MCHC: 32 g/dL (ref 30.0–36.0)
MCV: 89.8 fL (ref 80.0–100.0)
Monocytes Absolute: 0.5 10*3/uL (ref 0.1–1.0)
Monocytes Relative: 8 %
Neutro Abs: 3.1 10*3/uL (ref 1.7–7.7)
Neutrophils Relative %: 45 %
Platelets: 323 10*3/uL (ref 150–400)
RBC: 4.9 MIL/uL (ref 4.22–5.81)
RDW: 13 % (ref 11.5–15.5)
WBC: 6.9 10*3/uL (ref 4.0–10.5)
nRBC: 0 % (ref 0.0–0.2)

## 2018-10-02 LAB — COMPREHENSIVE METABOLIC PANEL
ALT: 24 U/L (ref 0–44)
AST: 18 U/L (ref 15–41)
Albumin: 4.1 g/dL (ref 3.5–5.0)
Alkaline Phosphatase: 89 U/L (ref 38–126)
Anion gap: 7 (ref 5–15)
BUN: 16 mg/dL (ref 6–20)
CO2: 27 mmol/L (ref 22–32)
Calcium: 9.1 mg/dL (ref 8.9–10.3)
Chloride: 108 mmol/L (ref 98–111)
Creatinine, Ser: 1 mg/dL (ref 0.61–1.24)
GFR calc Af Amer: >60 mL/min (ref >60)
GFR calc non Af Amer: >60 mL/min (ref >60)
Glucose, Bld: 93 mg/dL (ref 70–99)
Potassium: 4.5 mmol/L (ref 3.5–5.1)
Sodium: 142 mmol/L (ref 135–145)
Total Bilirubin: 0.7 mg/dL (ref 0.3–1.2)
Total Protein: 7.6 g/dL (ref 6.5–8.1)

## 2018-10-03 LAB — NOVEL CORONAVIRUS, NAA (HOSP ORDER, SEND-OUT TO REF LAB; TAT 18-24 HRS): SARS-CoV-2, NAA: NOT DETECTED

## 2018-10-07 ENCOUNTER — Encounter (HOSPITAL_COMMUNITY): Payer: Self-pay

## 2018-10-07 ENCOUNTER — Ambulatory Visit (HOSPITAL_COMMUNITY)
Admission: RE | Admit: 2018-10-07 | Discharge: 2018-10-07 | Disposition: A | Discharge status: Home / Self Care | Payer: BLUE CROSS/BLUE SHIELD | Attending: General Surgery | Admitting: General Surgery

## 2018-10-07 ENCOUNTER — Encounter (HOSPITAL_COMMUNITY)
Admission: RE | Disposition: A | Discharge status: Home / Self Care | Payer: Self-pay | Source: Home / Self Care | Attending: General Surgery

## 2018-10-07 ENCOUNTER — Other Ambulatory Visit: Payer: Self-pay

## 2018-10-07 ENCOUNTER — Ambulatory Visit (HOSPITAL_COMMUNITY): Payer: BLUE CROSS/BLUE SHIELD | Admitting: Anesthesiology

## 2018-10-07 DIAGNOSIS — Z79890 Hormone replacement therapy (postmenopausal): Secondary | ICD-10-CM | POA: Insufficient documentation

## 2018-10-07 DIAGNOSIS — E039 Hypothyroidism, unspecified: Secondary | ICD-10-CM | POA: Diagnosis not present

## 2018-10-07 DIAGNOSIS — K8010 Calculus of gallbladder with chronic cholecystitis without obstruction: Secondary | ICD-10-CM | POA: Insufficient documentation

## 2018-10-07 DIAGNOSIS — K8020 Calculus of gallbladder without cholecystitis without obstruction: Secondary | ICD-10-CM | POA: Diagnosis not present

## 2018-10-07 DIAGNOSIS — E785 Hyperlipidemia, unspecified: Secondary | ICD-10-CM | POA: Insufficient documentation

## 2018-10-07 DIAGNOSIS — Z79899 Other long term (current) drug therapy: Secondary | ICD-10-CM | POA: Diagnosis not present

## 2018-10-07 DIAGNOSIS — F17210 Nicotine dependence, cigarettes, uncomplicated: Secondary | ICD-10-CM | POA: Insufficient documentation

## 2018-10-07 HISTORY — PX: CHOLECYSTECTOMY: SHX55

## 2018-10-07 SURGERY — LAPAROSCOPIC CHOLECYSTECTOMY
Anesthesia: General | Site: Abdomen

## 2018-10-07 MED ORDER — FENTANYL CITRATE (PF) 100 MCG/2ML IJ SOLN
INTRAMUSCULAR | Status: DC | PRN
  Administered 2018-10-07 (×2): 100 ug via INTRAVENOUS

## 2018-10-07 MED ORDER — SUCCINYLCHOLINE CHLORIDE 200 MG/10ML IV SOSY
PREFILLED_SYRINGE | INTRAVENOUS | Status: AC
  Filled 2018-10-07: qty 10

## 2018-10-07 MED ORDER — LIDOCAINE 2% (20 MG/ML) 5 ML SYRINGE
INTRAMUSCULAR | Status: AC
  Filled 2018-10-07: qty 5

## 2018-10-07 MED ORDER — PROPOFOL 10 MG/ML IV BOLUS
INTRAVENOUS | Status: AC
  Filled 2018-10-07: qty 20

## 2018-10-07 MED ORDER — PROMETHAZINE HCL 25 MG/ML IJ SOLN: 6.2500 mg | INTRAMUSCULAR | Status: DC | PRN

## 2018-10-07 MED ORDER — LACTATED RINGERS IV SOLN: INTRAVENOUS | Status: DC

## 2018-10-07 MED ORDER — SODIUM CHLORIDE 0.9 % IR SOLN
Status: DC | PRN
  Administered 2018-10-07: 1000 mL

## 2018-10-07 MED ORDER — HYDROCODONE-ACETAMINOPHEN 7.5-325 MG PO TABS: 1.0000 | ORAL_TABLET | Freq: Once | ORAL | Status: DC | PRN

## 2018-10-07 MED ORDER — SUCCINYLCHOLINE CHLORIDE 20 MG/ML IJ SOLN
INTRAMUSCULAR | Status: DC | PRN
  Administered 2018-10-07: 120 mg via INTRAVENOUS

## 2018-10-07 MED ORDER — CHLORHEXIDINE GLUCONATE CLOTH 2 % EX PADS: 6.0000 | MEDICATED_PAD | Freq: Once | CUTANEOUS | Status: DC

## 2018-10-07 MED ORDER — FENTANYL CITRATE (PF) 100 MCG/2ML IJ SOLN
INTRAMUSCULAR | Status: AC
  Filled 2018-10-07: qty 4

## 2018-10-07 MED ORDER — BUPIVACAINE LIPOSOME 1.3 % IJ SUSP
INTRAMUSCULAR | Status: DC | PRN
  Administered 2018-10-07: 20 mL

## 2018-10-07 MED ORDER — CEFAZOLIN SODIUM-DEXTROSE 2-4 GM/100ML-% IV SOLN
2.0000 g | INTRAVENOUS | Status: AC
  Administered 2018-10-07: 2 g via INTRAVENOUS

## 2018-10-07 MED ORDER — ROCURONIUM BROMIDE 100 MG/10ML IV SOLN
INTRAVENOUS | Status: DC | PRN
  Administered 2018-10-07: 40 mg via INTRAVENOUS
  Administered 2018-10-07: 10 mg via INTRAVENOUS

## 2018-10-07 MED ORDER — BUPIVACAINE LIPOSOME 1.3 % IJ SUSP
INTRAMUSCULAR | Status: AC
  Filled 2018-10-07: qty 20

## 2018-10-07 MED ORDER — DEXAMETHASONE SODIUM PHOSPHATE 10 MG/ML IJ SOLN
INTRAMUSCULAR | Status: AC
  Filled 2018-10-07: qty 1

## 2018-10-07 MED ORDER — LACTATED RINGERS IV SOLN
INTRAVENOUS | Status: DC
  Administered 2018-10-07: 09:00:00 via INTRAVENOUS

## 2018-10-07 MED ORDER — ONDANSETRON HCL 4 MG/2ML IJ SOLN
INTRAMUSCULAR | Status: DC | PRN
  Administered 2018-10-07: 4 mg via INTRAVENOUS

## 2018-10-07 MED ORDER — SUGAMMADEX SODIUM 500 MG/5ML IV SOLN
INTRAVENOUS | Status: DC | PRN
  Administered 2018-10-07: 500 mg via INTRAVENOUS

## 2018-10-07 MED ORDER — HYDROMORPHONE HCL 1 MG/ML IJ SOLN
0.2500 mg | INTRAMUSCULAR | Status: DC | PRN
  Administered 2018-10-07 (×2): 0.5 mg via INTRAVENOUS
  Filled 2018-10-07 (×2): qty 0.5

## 2018-10-07 MED ORDER — MEPERIDINE HCL 50 MG/ML IJ SOLN: 6.2500 mg | INTRAMUSCULAR | Status: DC | PRN

## 2018-10-07 MED ORDER — KETOROLAC TROMETHAMINE 30 MG/ML IJ SOLN
30.0000 mg | Freq: Once | INTRAMUSCULAR | Status: AC
  Administered 2018-10-07: 30 mg via INTRAVENOUS
  Filled 2018-10-07: qty 1

## 2018-10-07 MED ORDER — MIDAZOLAM HCL 5 MG/5ML IJ SOLN
INTRAMUSCULAR | Status: DC | PRN
  Administered 2018-10-07: 2 mg via INTRAVENOUS

## 2018-10-07 MED ORDER — ROCURONIUM BROMIDE 10 MG/ML (PF) SYRINGE
PREFILLED_SYRINGE | INTRAVENOUS | Status: AC
  Filled 2018-10-07: qty 10

## 2018-10-07 MED ORDER — OXYCODONE-ACETAMINOPHEN 7.5-325 MG PO TABS: 1.0000 | ORAL_TABLET | Freq: Four times a day (QID) | ORAL | 0 refills | Status: AC | PRN

## 2018-10-07 MED ORDER — MIDAZOLAM HCL 2 MG/2ML IJ SOLN
INTRAMUSCULAR | Status: AC
  Filled 2018-10-07: qty 2

## 2018-10-07 MED ORDER — DEXAMETHASONE SODIUM PHOSPHATE 10 MG/ML IJ SOLN
INTRAMUSCULAR | Status: DC | PRN
  Administered 2018-10-07: 10 mg via INTRAVENOUS

## 2018-10-07 MED ORDER — HEMOSTATIC AGENTS (NO CHARGE) OPTIME
TOPICAL | Status: DC | PRN
  Administered 2018-10-07: 1 via TOPICAL

## 2018-10-07 MED ORDER — CEFAZOLIN SODIUM-DEXTROSE 2-4 GM/100ML-% IV SOLN
INTRAVENOUS | Status: AC
  Filled 2018-10-07: qty 100

## 2018-10-07 MED ORDER — ONDANSETRON HCL 4 MG/2ML IJ SOLN
INTRAMUSCULAR | Status: AC
  Filled 2018-10-07: qty 2

## 2018-10-07 MED ORDER — PROPOFOL 10 MG/ML IV BOLUS
INTRAVENOUS | Status: DC | PRN
  Administered 2018-10-07: 200 mg via INTRAVENOUS

## 2018-10-07 SURGICAL SUPPLY — 42 items
APPLIER CLIP ROT 10 11.4 M/L (STAPLE) ×2
BAG RETRIEVAL 10 (BASKET) ×1
CHLORAPREP W/TINT 26 (MISCELLANEOUS) ×2 IMPLANT
CLIP APPLIE ROT 10 11.4 M/L (STAPLE) ×1 IMPLANT
CLOTH BEACON ORANGE TIMEOUT ST (SAFETY) ×2 IMPLANT
COVER LIGHT HANDLE STERIS (MISCELLANEOUS) ×4 IMPLANT
COVER WAND RF STERILE (DRAPES) ×2 IMPLANT
DERMABOND ADVANCED (GAUZE/BANDAGES/DRESSINGS) ×1
DERMABOND ADVANCED .7 DNX12 (GAUZE/BANDAGES/DRESSINGS) ×1 IMPLANT
ELECT REM PT RETURN 9FT ADLT (ELECTROSURGICAL) ×2
ELECTRODE REM PT RTRN 9FT ADLT (ELECTROSURGICAL) ×1 IMPLANT
FILTER SMOKE EVAC LAPAROSHD (FILTER) ×2 IMPLANT
GLOVE BIOGEL PI IND STRL 7.0 (GLOVE) ×1 IMPLANT
GLOVE BIOGEL PI INDICATOR 7.0 (GLOVE) ×1
GLOVE SURG SS PI 7.5 STRL IVOR (GLOVE) ×2 IMPLANT
GOWN STRL REUS W/TWL LRG LVL3 (GOWN DISPOSABLE) ×6 IMPLANT
HEMOSTAT SNOW SURGICEL 2X4 (HEMOSTASIS) ×2 IMPLANT
INST SET LAPROSCOPIC AP (KITS) ×2 IMPLANT
IV NS IRRIG 3000ML ARTHROMATIC (IV SOLUTION) IMPLANT
KIT TURNOVER KIT A (KITS) ×2 IMPLANT
MANIFOLD NEPTUNE II (INSTRUMENTS) ×2 IMPLANT
NEEDLE HYPO 18GX1.5 BLUNT FILL (NEEDLE) ×2 IMPLANT
NEEDLE HYPO 22GX1.5 SAFETY (NEEDLE) ×2 IMPLANT
NEEDLE INSUFFLATION 14GA 120MM (NEEDLE) ×2 IMPLANT
NS IRRIG 1000ML POUR BTL (IV SOLUTION) ×2 IMPLANT
PACK LAP CHOLE LZT030E (CUSTOM PROCEDURE TRAY) ×2 IMPLANT
PAD ARMBOARD 7.5X6 YLW CONV (MISCELLANEOUS) ×2 IMPLANT
SET BASIN LINEN APH (SET/KITS/TRAYS/PACK) ×2 IMPLANT
SET TUBE IRRIG SUCTION NO TIP (IRRIGATION / IRRIGATOR) IMPLANT
SLEEVE ENDOPATH XCEL 5M (ENDOMECHANICALS) ×2 IMPLANT
SPONGE GAUZE 2X2 8PLY STRL LF (GAUZE/BANDAGES/DRESSINGS) ×8 IMPLANT
SUT MNCRL AB 4-0 PS2 18 (SUTURE) ×4 IMPLANT
SUT VICRYL 0 UR6 27IN ABS (SUTURE) ×2 IMPLANT
SYR 20CC LL (SYRINGE) ×2 IMPLANT
SYS BAG RETRIEVAL 10MM (BASKET) ×1
SYSTEM BAG RETRIEVAL 10MM (BASKET) ×1 IMPLANT
TROCAR ENDO BLADELESS 11MM (ENDOMECHANICALS) ×2 IMPLANT
TROCAR XCEL NON-BLD 5MMX100MML (ENDOMECHANICALS) ×2 IMPLANT
TROCAR XCEL UNIV SLVE 11M 100M (ENDOMECHANICALS) ×2 IMPLANT
TUBE CONNECTING 12X1/4 (SUCTIONS) ×2 IMPLANT
TUBING INSUFFLATION (TUBING) ×2 IMPLANT
WARMER LAPAROSCOPE (MISCELLANEOUS) ×2 IMPLANT

## 2018-10-07 NOTE — Transfer of Care (Signed)
Immediate Anesthesia Transfer of Care Note  Patient: Carl Barton  Procedure(s) Performed: LAPAROSCOPIC CHOLECYSTECTOMY (N/A Abdomen)  Patient Location: PACU  Anesthesia Type:General  Level of Consciousness: awake, alert  and oriented  Airway & Oxygen Therapy: Patient Spontanous Breathing  Post-op Assessment: Report given to RN and Post -op Vital signs reviewed and stable  Post vital signs: Reviewed and stable  Last Vitals:  Vitals Value Taken Time  BP 163/88 10/07/2018 10:00 AM  Temp    Pulse 88 10/07/2018 10:06 AM  Resp 17 10/07/2018 10:06 AM  SpO2 96 % 10/07/2018 10:06 AM  Vitals shown include unvalidated device data.  Last Pain:  Vitals:   10/07/18 0751  TempSrc: Oral  PainSc: 0-No pain         Complications: No apparent anesthesia complications

## 2018-10-07 NOTE — Anesthesia Postprocedure Evaluation (Signed)
Anesthesia Post Note  Patient: Carl Barton  Procedure(s) Performed: LAPAROSCOPIC CHOLECYSTECTOMY (N/A Abdomen)  Patient location during evaluation: PACU Anesthesia Type: General Level of consciousness: awake and alert, oriented and patient cooperative Pain management: pain level controlled Vital Signs Assessment: post-procedure vital signs reviewed and stable Respiratory status: spontaneous breathing and nonlabored ventilation Cardiovascular status: stable Postop Assessment: no headache Anesthetic complications: no     Last Vitals:  Vitals:   10/07/18 0958 10/07/18 1000  BP: (!) 153/98 (!) 163/88  Pulse: 91 90  Resp:  14  Temp:    SpO2: 95% 95%    Last Pain:  Vitals:   10/07/18 0751  TempSrc: Oral  PainSc: 0-No pain                 Betti Goodenow

## 2018-10-07 NOTE — Discharge Instructions (Signed)
Laparoscopic Cholecystectomy, Care After °This sheet gives you information about how to care for yourself after your procedure. Your health care provider may also give you more specific instructions. If you have problems or questions, contact your health care provider. °What can I expect after the procedure? °After the procedure, it is common to have: °· Pain at your incision sites. You will be given medicines to control this pain. °· Mild nausea or vomiting. °· Bloating and possible shoulder pain from the air-like gas that was used during the procedure. °Follow these instructions at home: °Incision care ° °· Follow instructions from your health care provider about how to take care of your incisions. Make sure you: °? Wash your hands with soap and water before you change your bandage (dressing). If soap and water are not available, use hand sanitizer. °? Change your dressing as told by your health care provider. °? Leave stitches (sutures), skin glue, or adhesive strips in place. These skin closures may need to be in place for 2 weeks or longer. If adhesive strip edges start to loosen and curl up, you may trim the loose edges. Do not remove adhesive strips completely unless your health care provider tells you to do that. °· Do not take baths, swim, or use a hot tub until your health care provider approves. Ask your health care provider if you can take showers. You may only be allowed to take sponge baths for bathing. °· Check your incision area every day for signs of infection. Check for: °? More redness, swelling, or pain. °? More fluid or blood. °? Warmth. °? Pus or a bad smell. °Activity °· Do not drive or use heavy machinery while taking prescription pain medicine. °· Do not lift anything that is heavier than 10 lb (4.5 kg) until your health care provider approves. °· Do not play contact sports until your health care provider approves. °· Do not drive for 24 hours if you were given a medicine to help you relax  (sedative). °· Rest as needed. Do not return to work or school until your health care provider approves. °General instructions °· Take over-the-counter and prescription medicines only as told by your health care provider. °· To prevent or treat constipation while you are taking prescription pain medicine, your health care provider may recommend that you: °? Drink enough fluid to keep your urine clear or pale yellow. °? Take over-the-counter or prescription medicines. °? Eat foods that are high in fiber, such as fresh fruits and vegetables, whole grains, and beans. °? Limit foods that are high in fat and processed sugars, such as fried and sweet foods. °Contact a health care provider if: °· You develop a rash. °· You have more redness, swelling, or pain around your incisions. °· You have more fluid or blood coming from your incisions. °· Your incisions feel warm to the touch. °· You have pus or a bad smell coming from your incisions. °· You have a fever. °· One or more of your incisions breaks open. °Get help right away if: °· You have trouble breathing. °· You have chest pain. °· You have increasing pain in your shoulders. °· You faint or feel dizzy when you stand. °· You have severe pain in your abdomen. °· You have nausea or vomiting that lasts for more than one day. °· You have leg pain. °This information is not intended to replace advice given to you by your health care provider. Make sure you discuss any questions you have   with your health care provider. °Document Released: 05/06/2005 Document Revised: 11/25/2015 Document Reviewed: 10/23/2015 °Elsevier Interactive Patient Education © 2019 [employer] ° ° ° ° °Monitored Anesthesia Care, Care After °These instructions provide you with information about caring for yourself after your procedure. Your health care provider may also give you more specific instructions. Your treatment has been planned according to current medical practices, but problems sometimes  occur. Call your health care provider if you have any problems or questions after your procedure. °What can I expect after the procedure? °After your procedure, you may: °· Feel sleepy for several hours. °· Feel clumsy and have poor balance for several hours. °· Feel forgetful about what happened after the procedure. °· Have poor judgment for several hours. °· Feel nauseous or vomit. °· Have a sore throat if you had a breathing tube during the procedure. °Follow these instructions at home: °For at least 24 hours after the procedure: ° °  ° °· Have a responsible adult stay with you. It is important to have someone help care for you until you are awake and alert. °· Rest as needed. °· Do not: °? Participate in activities in which you could fall or become injured. °? Drive. °? Use heavy machinery. °? Drink alcohol. °? Take sleeping pills or medicines that cause drowsiness. °? Make important decisions or sign legal documents. °? Take care of children on your own. °Eating and drinking °· Follow the diet that is recommended by your health care provider. °· If you vomit, drink water, juice, or soup when you can drink without vomiting. °· Make sure you have little or no nausea before eating solid foods. °General instructions °· Take over-the-counter and prescription medicines only as told by your health care provider. °· If you have sleep apnea, surgery and certain medicines can increase your risk for breathing problems. Follow instructions from your health care provider about wearing your sleep device: °? Anytime you are sleeping, including during daytime naps. °? While taking prescription pain medicines, sleeping medicines, or medicines that make you drowsy. °· If you smoke, do not smoke without supervision. °· Keep all follow-up visits as told by your health care provider. This is important. °Contact a health care provider if: °· You keep feeling nauseous or you keep vomiting. °· You feel light-headed. °· You develop a  rash. °· You have a fever. °Get help right away if: °· You have trouble breathing. °Summary °· For several hours after your procedure, you may feel sleepy and have poor judgment. °· Have a responsible adult stay with you for at least 24 hours or until you are awake and alert. °This information is not intended to replace advice given to you by your health care provider. Make sure you discuss any questions you have with your health care provider. °Document Released: 08/27/2015 Document Revised: 12/20/2016 Document Reviewed: 08/27/2015 °Elsevier Interactive Patient Education © 2019 [employer] ° °

## 2018-10-07 NOTE — Op Note (Signed)
Patient:  Carl Barton [facility]  DOB:  11-Sep-1960  MRN:  [id number]   Preop Diagnosis: Cholecystitis, cholelithiasis  Postop Diagnosis: Same  Procedure: Laparoscopic cholecystectomy  Surgeon: Franky Macho, MD  Anes: General endotracheal  Indications: Patient is a 58 year old white male who presents with biliary colic and cholecystitis secondary to cholelithiasis.  The risks and benefits of the procedure including bleeding, infection, hepatobiliary injury, and the possibility of an open procedure were fully explained to the patient, who gave informed consent.  Procedure note: The patient was placed in the supine position.  After induction of general endotracheal anesthesia, the abdomen was prepped and draped using the usual sterile technique with ChloraPrep.  Surgical site confirmation was performed.  A supraumbilical incision was made down to the fascia.  A Veress needle was introduced into the abdominal cavity and confirmation of placement was done using the saline drop test.  The abdomen was then insufflated to 16 mmHg pressure.  An 11 mm trocar was introduced into the abdominal cavity under direct visualization without difficulty.  The patient was placed in reverse Trendelenburg position and an additional 1 mm trocar was placed in the epigastric region and 5 mm trochars were placed the right upper quadrant and right flank regions.  The liver was inspected noted to be within normal limits.  The gallbladder was noted to be distended with a large gallstone in the infundibulum.  The gallbladder was decompressed using suction in order to facilitate exposure.  The gallbladder was then retracted in a dynamic fashion in order to provide a critical view of the triangle of Calot.  The cystic duct was first identified.  Its juncture to the infundibulum was fully identified.  Endoclips were placed proximally and distally on the cystic duct, and the cystic duct was divided.  This was likewise done to the  cystic artery.  The gallbladder was freed away from the gallbladder fossa using Bovie electrocautery.  The gallbladder was delivered through the epigastric trocar site using an Endo Catch bag.  The gallbladder fossa was inspected and no abnormal bleeding or bile leakage was noted.  Surgicel was placed in the gallbladder fossa.  All fluid and air were then evacuated from the abdominal cavity prior to removal of the trochars.  All wounds were irrigated with normal saline.  All wounds were injected with Exparel.  The epigastric fascia as well as the supraumbilical fascia were reapproximated using 0 Vicryl interrupted sutures.  All skin incisions were closed using 4-0 Monocryl subcuticular sutures.  Dermabond was applied.  All tape and needle counts were correct at the end of the procedure.  The patient was extubated in the operating room and transferred to PACU in stable condition.  Complications: None  EBL: Minimal  Specimen: Gallbladder

## 2018-10-07 NOTE — Anesthesia Procedure Notes (Signed)
Procedure Name: Intubation Date/Time: 10/07/2018 9:14 AM Performed by: Jonna Munro, CRNA Pre-anesthesia Checklist: Patient identified, Emergency Drugs available, Suction available, Patient being monitored and Timeout performed Patient Re-evaluated:Patient Re-evaluated prior to induction Oxygen Delivery Method: Circle system utilized Preoxygenation: Pre-oxygenation with 100% oxygen Induction Type: IV induction Ventilation: Oral airway inserted - appropriate to patient size Laryngoscope Size: Glidescope Grade View: Grade I Tube type: Oral Tube size: 7.0 mm Number of attempts: 1 Airway Equipment and Method: Stylet Placement Confirmation: ETT inserted through vocal cords under direct vision,  positive ETCO2 and breath sounds checked- equal and bilateral Secured at: 22 cm Tube secured with: Tape Dental Injury: Teeth and Oropharynx as per pre-operative assessment  Comments: DL with MAC 4, unable to visualize cords, DL with glidescope lopro 3, able to see cords, atraumatic intubation, +ETCO2, +=BS.

## 2018-10-07 NOTE — Interval H&P Note (Signed)
History and Physical Interval Note:  10/07/2018 8:25 AM  Carl Barton  has presented today for surgery, with the diagnosis of gallstones.  The various methods of treatment have been discussed with the patient and family. After consideration of risks, benefits and other options for treatment, the patient has consented to  Procedure(s): LAPAROSCOPIC CHOLECYSTECTOMY (N/A) as a surgical intervention.  The patient's history has been reviewed, patient examined, no change in status, stable for surgery.  I have reviewed the patient's chart and labs.  Questions were answered to the patient's satisfaction.     Franky Macho

## 2018-10-07 NOTE — Anesthesia Preprocedure Evaluation (Signed)
Anesthesia Evaluation    Airway Mallampati: II       Dental  (+) Teeth Intact   Pulmonary former smoker,    breath sounds clear to auscultation       Cardiovascular  Rhythm:regular     Neuro/Psych    GI/Hepatic   Endo/Other  Hypothyroidism   Renal/GU      Musculoskeletal   Abdominal   Peds  Hematology   Anesthesia Other Findings Denies any CV, DM, neuro, pulm Active as electrician  Reproductive/Obstetrics                             Anesthesia Physical Anesthesia Plan  ASA: II  Anesthesia Plan: General   Post-op Pain Management:    Induction:   PONV Risk Score and Plan:   Airway Management Planned:   Additional Equipment:   Intra-op Plan:   Post-operative Plan:   Informed Consent: I have reviewed the patients History and Physical, chart, labs and discussed the procedure including the risks, benefits and alternatives for the proposed anesthesia with the patient or authorized representative who has indicated his/her understanding and acceptance.       Plan Discussed with: Anesthesiologist  Anesthesia Plan Comments:         Anesthesia Quick Evaluation

## 2018-10-07 NOTE — Progress Notes (Signed)
Relieved Broadus John, RN

## 2018-10-08 ENCOUNTER — Encounter (HOSPITAL_COMMUNITY): Payer: Self-pay | Admitting: General Surgery

## 2018-10-15 ENCOUNTER — Telehealth (INDEPENDENT_AMBULATORY_CARE_PROVIDER_SITE_OTHER): Payer: Self-pay | Admitting: General Surgery

## 2018-10-15 DIAGNOSIS — Z09 Encounter for follow-up examination after completed treatment for conditions other than malignant neoplasm: Secondary | ICD-10-CM | POA: Insufficient documentation

## 2018-10-15 NOTE — Telephone Encounter (Signed)
Called patient for virtual postoperative visit.  Patient states he is doing well.  He has no complaints.  He was instructed to call me should any problems arise.  He is pleased with the results.

## 2018-11-30 DIAGNOSIS — R7309 Other abnormal glucose: Secondary | ICD-10-CM | POA: Diagnosis not present

## 2018-11-30 DIAGNOSIS — Z008 Encounter for other general examination: Secondary | ICD-10-CM | POA: Diagnosis not present

## 2018-11-30 DIAGNOSIS — E782 Mixed hyperlipidemia: Secondary | ICD-10-CM | POA: Diagnosis not present

## 2018-11-30 DIAGNOSIS — E559 Vitamin D deficiency, unspecified: Secondary | ICD-10-CM | POA: Diagnosis not present

## 2018-11-30 DIAGNOSIS — E039 Hypothyroidism, unspecified: Secondary | ICD-10-CM | POA: Diagnosis not present

## 2018-11-30 DIAGNOSIS — Z719 Counseling, unspecified: Secondary | ICD-10-CM | POA: Diagnosis not present

## 2018-12-16 DIAGNOSIS — E039 Hypothyroidism, unspecified: Secondary | ICD-10-CM | POA: Diagnosis not present

## 2018-12-16 DIAGNOSIS — R7309 Other abnormal glucose: Secondary | ICD-10-CM | POA: Diagnosis not present

## 2018-12-16 DIAGNOSIS — E782 Mixed hyperlipidemia: Secondary | ICD-10-CM | POA: Diagnosis not present

## 2018-12-16 DIAGNOSIS — E559 Vitamin D deficiency, unspecified: Secondary | ICD-10-CM | POA: Diagnosis not present

## 2019-03-15 DIAGNOSIS — Z79899 Other long term (current) drug therapy: Secondary | ICD-10-CM | POA: Diagnosis not present

## 2019-03-15 DIAGNOSIS — Z0130 Encounter for examination of blood pressure without abnormal findings: Secondary | ICD-10-CM | POA: Diagnosis not present

## 2019-03-15 DIAGNOSIS — E559 Vitamin D deficiency, unspecified: Secondary | ICD-10-CM | POA: Diagnosis not present

## 2019-03-15 DIAGNOSIS — E039 Hypothyroidism, unspecified: Secondary | ICD-10-CM | POA: Diagnosis not present

## 2019-03-15 DIAGNOSIS — Z139 Encounter for screening, unspecified: Secondary | ICD-10-CM | POA: Diagnosis not present

## 2019-03-15 DIAGNOSIS — E782 Mixed hyperlipidemia: Secondary | ICD-10-CM | POA: Diagnosis not present

## 2019-03-24 DIAGNOSIS — E559 Vitamin D deficiency, unspecified: Secondary | ICD-10-CM | POA: Diagnosis not present

## 2019-03-24 DIAGNOSIS — E039 Hypothyroidism, unspecified: Secondary | ICD-10-CM | POA: Diagnosis not present

## 2019-03-24 DIAGNOSIS — E782 Mixed hyperlipidemia: Secondary | ICD-10-CM | POA: Diagnosis not present

## 2019-11-08 IMAGING — US ULTRASOUND ABDOMEN LIMITED
1 series · 14 of 25 positions shown · non-contrast
Comparison: None.

CLINICAL DATA: Acute right upper quadrant abdominal pain.

EXAM:
ULTRASOUND ABDOMEN LIMITED RIGHT UPPER QUADRANT

[Series 1: ultrasound abdomen limited · 14 of 58 slices shown]
[im 1/58]
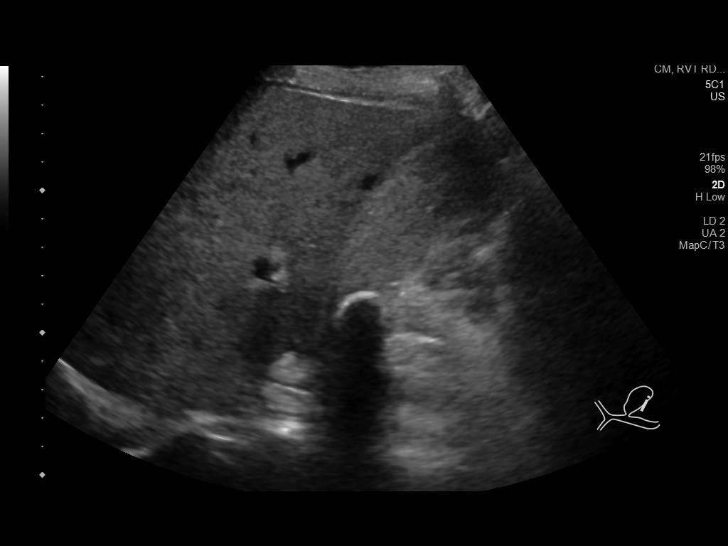
[im 5/58]
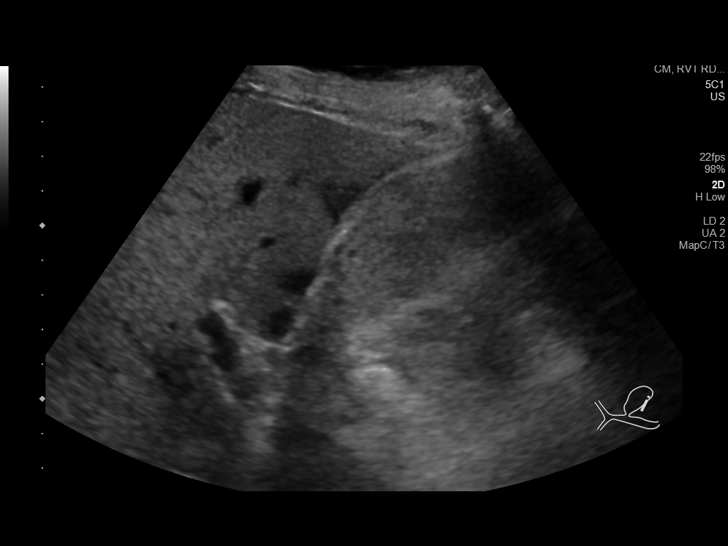
[im 10/58]
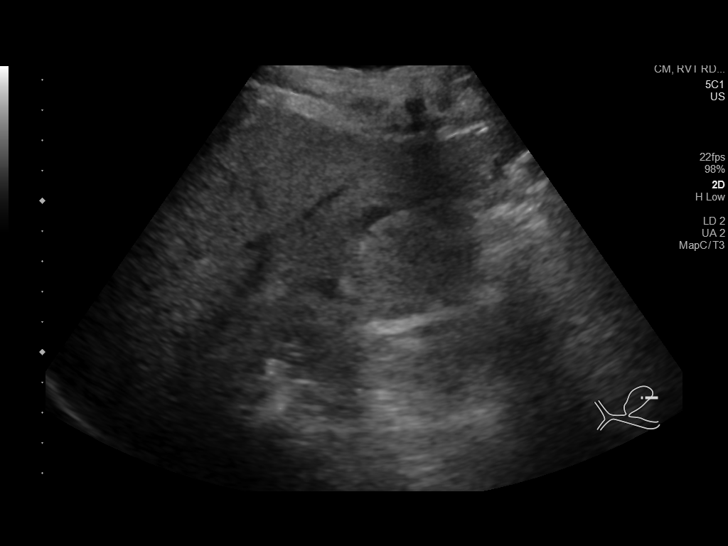
[im 15/58]
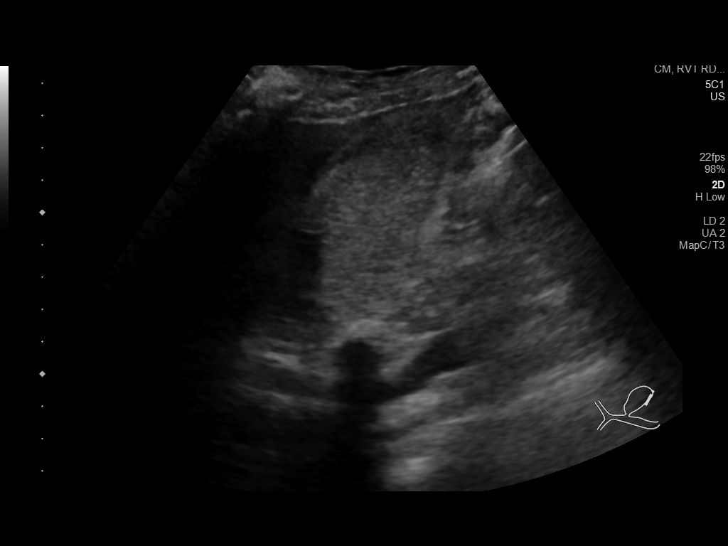
[im 20/58]
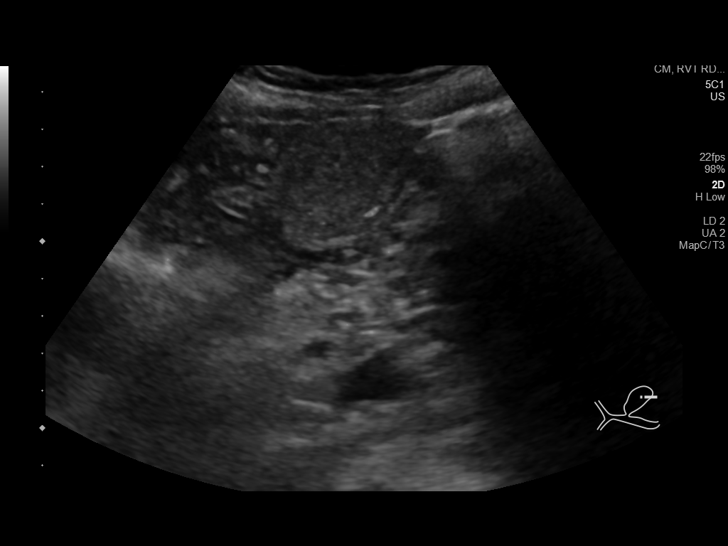
[im 22/58]
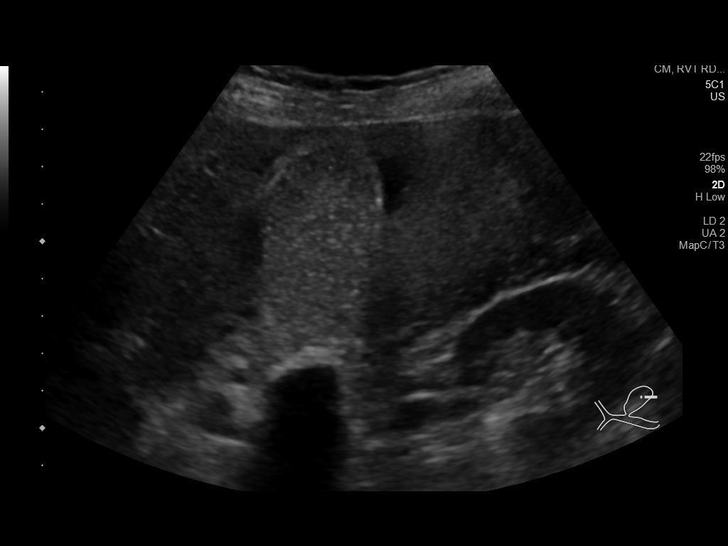
[im 27/58]
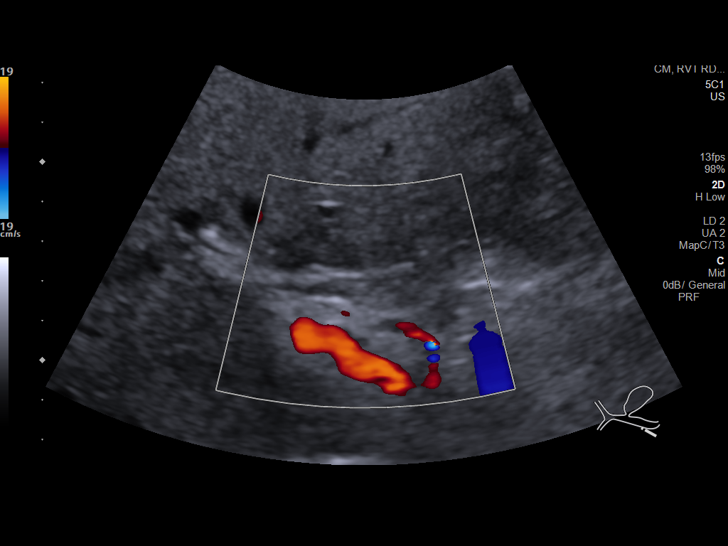
[im 31/58]
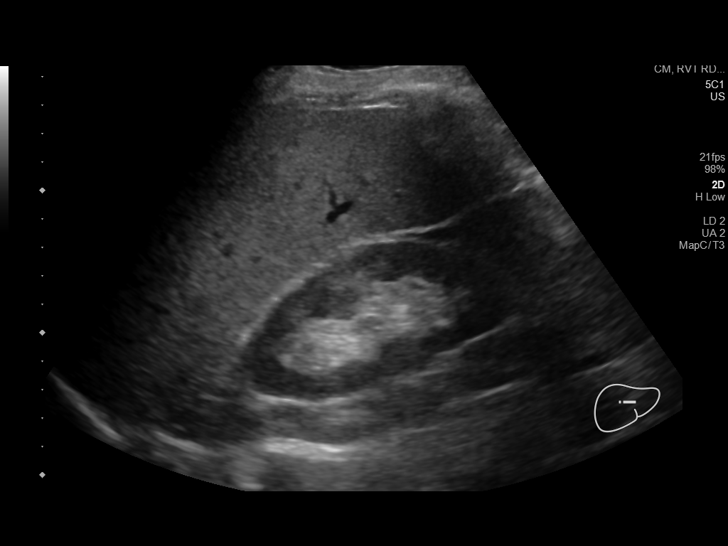
[im 36/58]
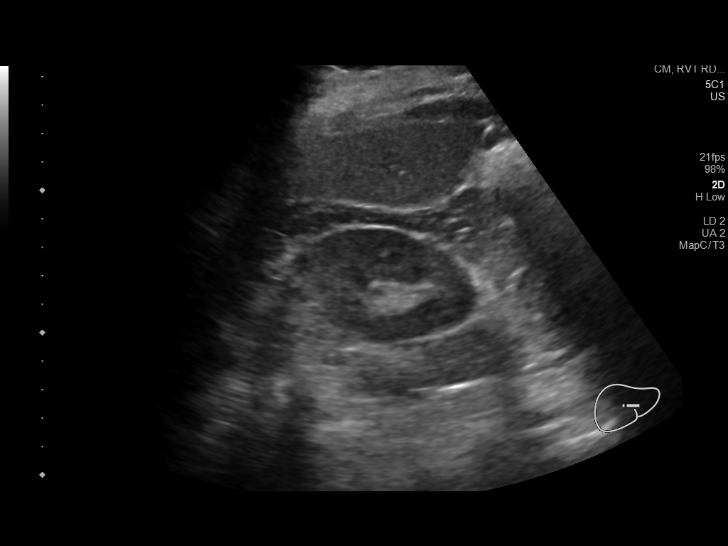
[im 39/58]
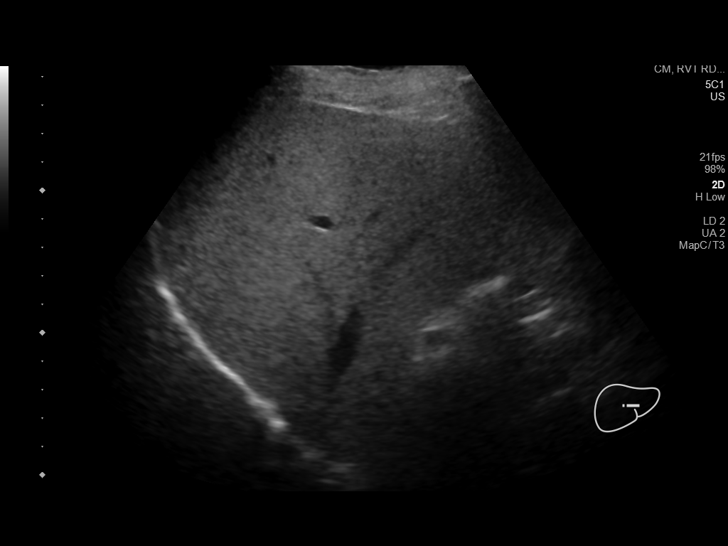
[im 43/58]
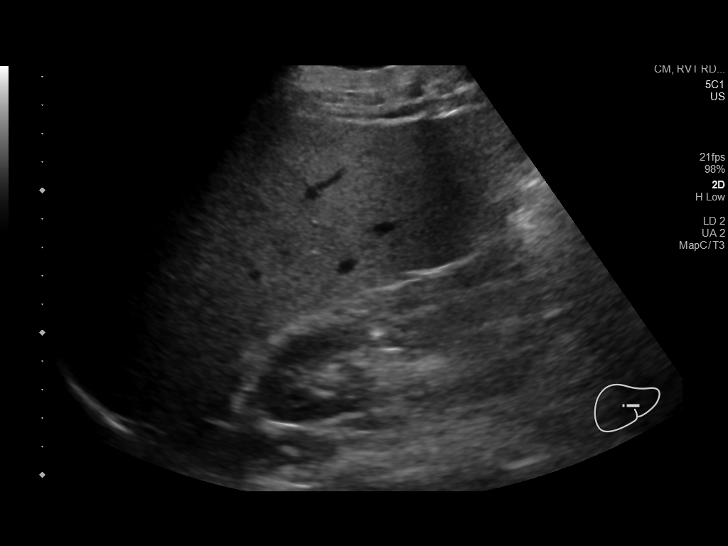
[im 48/58]
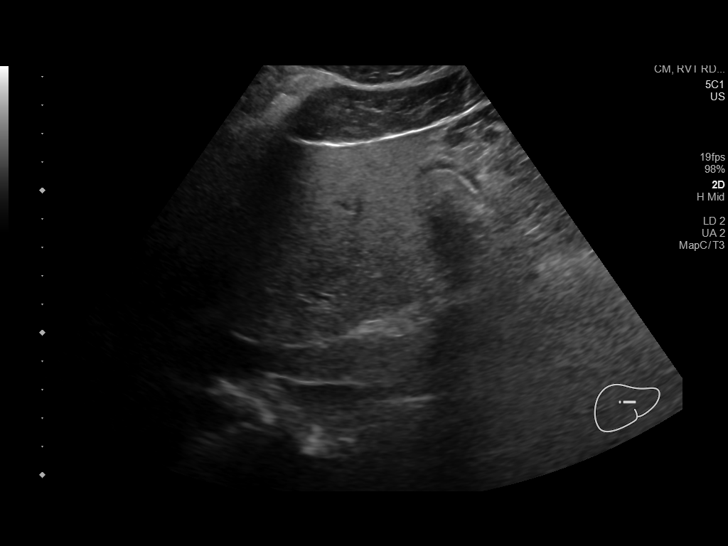
[im 53/58]
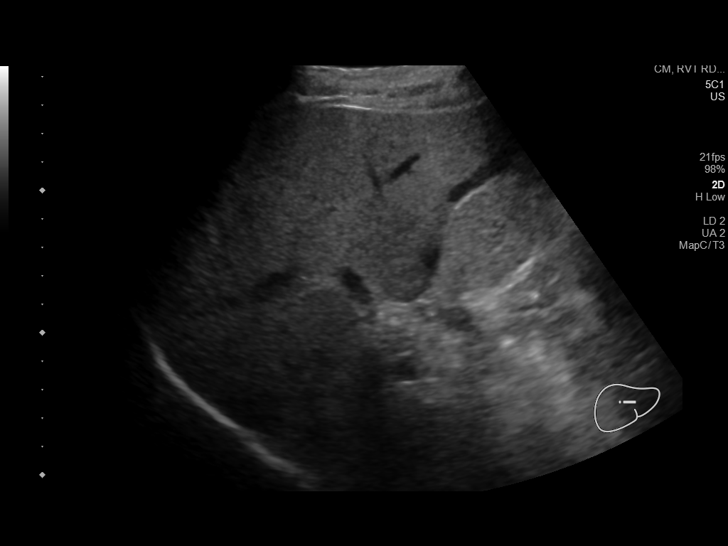
[im 58/58]
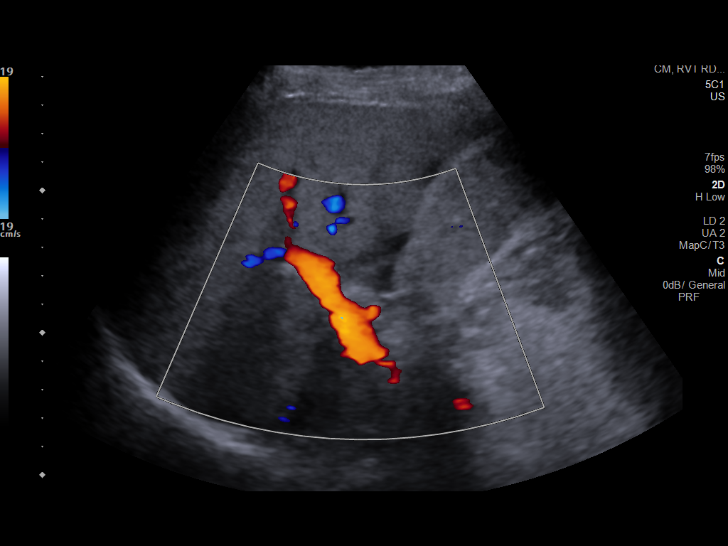

[14 of 25 positions shown; findings below may reference images not displayed]

FINDINGS: Gallbladder:

2.2 cm gallstone is noted. Sludge is also noted within gallbladder
lumen. No definite gallbladder wall thickening or pericholecystic
fluid is noted. No sonographic Murphy's sign is noted.

Common bile duct:

Diameter: 6 mm which is within normal limits.

Liver:

Increased echogenicity of hepatic parenchyma is noted most
consistent with fatty infiltration, with probable sparing along the
gallbladder fossa. Portal vein is patent on color Doppler imaging
with normal direction of blood flow towards the liver.
IMPRESSION: Cholelithiasis and sludge is noted within gallbladder lumen, but no
definite gallbladder wall thickening or pericholecystic fluid is
noted. Hepatic steatosis is noted with probable sparing adjacent to
gallbladder fossa.

## 2020-02-28 DIAGNOSIS — Z23 Encounter for immunization: Secondary | ICD-10-CM | POA: Diagnosis not present

## 2020-06-05 DIAGNOSIS — J029 Acute pharyngitis, unspecified: Secondary | ICD-10-CM | POA: Diagnosis not present

## 2020-06-05 DIAGNOSIS — J020 Streptococcal pharyngitis: Secondary | ICD-10-CM | POA: Diagnosis not present

## 2020-06-05 DIAGNOSIS — Z6827 Body mass index (BMI) 27.0-27.9, adult: Secondary | ICD-10-CM | POA: Diagnosis not present

## 2021-01-09 ENCOUNTER — Encounter: Payer: Self-pay | Admitting: Family Medicine

## 2021-01-09 ENCOUNTER — Ambulatory Visit (INDEPENDENT_AMBULATORY_CARE_PROVIDER_SITE_OTHER): Payer: BC Managed Care – PPO

## 2021-01-09 ENCOUNTER — Other Ambulatory Visit: Payer: Self-pay

## 2021-01-09 ENCOUNTER — Ambulatory Visit: Payer: BC Managed Care – PPO | Admitting: Family Medicine

## 2021-01-09 VITALS — BP 138/72 | HR 78 | Temp 97.8°F | Resp 20 | Ht 69.0 in | Wt 188.0 lb

## 2021-01-09 DIAGNOSIS — M5442 Lumbago with sciatica, left side: Secondary | ICD-10-CM

## 2021-01-09 DIAGNOSIS — M545 Low back pain, unspecified: Secondary | ICD-10-CM | POA: Diagnosis not present

## 2021-01-09 DIAGNOSIS — R202 Paresthesia of skin: Secondary | ICD-10-CM

## 2021-01-09 DIAGNOSIS — M5441 Lumbago with sciatica, right side: Secondary | ICD-10-CM

## 2021-01-09 DIAGNOSIS — G8929 Other chronic pain: Secondary | ICD-10-CM | POA: Diagnosis not present

## 2021-01-09 MED ORDER — METHYLPREDNISOLONE ACETATE 40 MG/ML IJ SUSP
60.0000 mg | Freq: Once | INTRAMUSCULAR | Status: AC
  Administered 2021-01-09: 60 mg via INTRAMUSCULAR

## 2021-01-09 NOTE — Progress Notes (Signed)
Subjective:  Patient ID: Carl Barton, male    DOB: 03-09-1961, 60 y.o.   MRN: [id number]  Patient Care Team: Baruch Gouty, FNP as PCP - General (Family Medicine)   Chief Complaint:  Numbness (In bilateral legs - ongoing )   HPI: Carl Barton is a 60 y.o. male presenting on 01/09/2021 for Numbness (In bilateral legs - ongoing )   Pt presents today with BLE numbness and tingling. Ongoing for several months. States intermittent lower back pain but not on a regular basis. States the numbness and tingling will start as his hips and go to toes at times and then start at toes and go to hips. States right is worse than left. No changes in temperature. No loss of function. No bowel or bladder incontinence. No known injury. States he has had labs in March at work, but nothing recently.    Relevant past medical, surgical, family, and social history reviewed and updated as indicated.  Allergies and medications reviewed and updated. Data reviewed: Chart in Epic.   Past Medical History:  Diagnosis Date   Chicken pox    Hyperlipidemia    Hypothyroidism    Thyroid disease     Past Surgical History:  Procedure Laterality Date   CHOLECYSTECTOMY N/A 10/07/2018   Procedure: LAPAROSCOPIC CHOLECYSTECTOMY;  Surgeon: Aviva Signs, MD;  Location: [facility];  Service: General;  Laterality: N/A;   never      Social History   Socioeconomic History   Marital status: Married    Spouse name: Not on file   Number of children: Not on file   Years of education: Not on file   Highest education level: Not on file  Occupational History   Not on file  Tobacco Use   Smoking status: Former    Packs/day: 1.00    Years: 15.00    Pack years: 15.00    Types: Cigarettes    Quit date: 05/20/1997    Years since quitting: 23.6   Smokeless tobacco: Never  Vaping Use   Vaping Use: Never used  Substance and Sexual Activity   Alcohol use: No    Alcohol/week: 0.0 standard drinks   Drug use:  No   Sexual activity: Yes  Other Topics Concern   Not on file  Social History Narrative   Not on file   Social Determinants of Health   Financial Resource Strain: Not on file  Food Insecurity: Not on file  Transportation Needs: Not on file  Physical Activity: Not on file  Stress: Not on file  Social Connections: Not on file  Intimate Partner Violence: Not on file    Outpatient Encounter Medications as of 01/09/2021  Medication Sig   cholecalciferol (VITAMIN D) 1000 units tablet Take 1 tablet (1,000 Units total) by mouth daily.   levothyroxine (SYNTHROID, LEVOTHROID) 100 MCG tablet Take 100 mcg by mouth daily before breakfast.   [EXPIRED] methylPREDNISolone acetate (DEPO-MEDROL) injection 60 mg    No facility-administered encounter medications on file as of 01/09/2021.    No Known Allergies  Review of Systems  Constitutional:  Negative for activity change, appetite change, chills, diaphoresis, fatigue, fever and unexpected weight change.  HENT: Negative.    Eyes: Negative.   Respiratory:  Negative for cough, chest tightness and shortness of breath.   Cardiovascular:  Negative for chest pain, palpitations and leg swelling.  Gastrointestinal:  Negative for abdominal pain, blood in stool, constipation, diarrhea, nausea and vomiting.  Endocrine: Negative.  Genitourinary:  Negative for decreased urine volume, difficulty urinating, dysuria, frequency and urgency.  Musculoskeletal:  Positive for back pain (intermittently). Negative for arthralgias, gait problem, joint swelling, myalgias, neck pain and neck stiffness.  Skin: Negative.   Allergic/Immunologic: Negative.   Neurological:  Positive for numbness (paresthesia to BLE). Negative for dizziness, tremors, seizures, syncope, facial asymmetry, speech difficulty, weakness, light-headedness and headaches.  Hematological: Negative.   Psychiatric/Behavioral:  Negative for agitation, behavioral problems, confusion, decreased  concentration, dysphoric mood, hallucinations, self-injury, sleep disturbance and suicidal ideas. The patient is not nervous/anxious and is not hyperactive.   All other systems reviewed and are negative.      Objective:  BP 138/72   Pulse 78   Temp 97.8 F (36.6 C)   Resp 20   Ht '5\' 9"'  (1.753 m)   Wt 188 lb (85.3 kg)   SpO2 97%   BMI 27.76 kg/m    Wt Readings from Last 3 Encounters:  01/09/21 188 lb (85.3 kg)  10/07/18 185 lb 13.6 oz (84.3 kg)  10/02/18 186 lb (84.4 kg)    Physical Exam Vitals and nursing note reviewed.  Constitutional:      General: He is not in acute distress.    Appearance: Normal appearance. He is well-developed and well-groomed. He is not ill-appearing, toxic-appearing or diaphoretic.  HENT:     Head: Normocephalic and atraumatic.     Jaw: There is normal jaw occlusion.     Right Ear: Hearing normal.     Left Ear: Hearing normal.     Nose: Nose normal.     Mouth/Throat:     Lips: Pink.     Mouth: Mucous membranes are moist.     Pharynx: Oropharynx is clear. Uvula midline.  Eyes:     General: Lids are normal.     Extraocular Movements: Extraocular movements intact.     Conjunctiva/sclera: Conjunctivae normal.     Pupils: Pupils are equal, round, and reactive to light.  Neck:     Thyroid: No thyroid mass, thyromegaly or thyroid tenderness.     Vascular: No carotid bruit or JVD.     Trachea: Trachea and phonation normal.  Cardiovascular:     Rate and Rhythm: Normal rate and regular rhythm.     Chest Wall: PMI is not displaced.     Pulses: Normal pulses.          Carotid pulses are 2+ on the right side and 2+ on the left side.      Radial pulses are 2+ on the right side and 2+ on the left side.       Femoral pulses are 2+ on the right side and 2+ on the left side.      Popliteal pulses are 2+ on the right side and 2+ on the left side.       Dorsalis pedis pulses are 2+ on the right side and 2+ on the left side.       Posterior tibial pulses  are 2+ on the right side and 2+ on the left side.     Heart sounds: Normal heart sounds. No murmur heard.   No friction rub. No gallop.  Pulmonary:     Effort: Pulmonary effort is normal. No respiratory distress.     Breath sounds: Normal breath sounds. No wheezing.  Abdominal:     General: Bowel sounds are normal. There is no distension or abdominal bruit.     Palpations: Abdomen is soft. There is no hepatomegaly  or splenomegaly.     Tenderness: There is no abdominal tenderness. There is no right CVA tenderness or left CVA tenderness.     Hernia: No hernia is present.  Musculoskeletal:        General: No swelling, tenderness, deformity or signs of injury. Normal range of motion.     Cervical back: Normal, normal range of motion and neck supple.     Thoracic back: Normal.     Lumbar back: Normal. No swelling, edema, deformity, signs of trauma, lacerations, spasms, tenderness or bony tenderness. Normal range of motion. Negative right straight leg raise test and negative left straight leg raise test. No scoliosis.     Right hip: Normal.     Left hip: Normal.     Right lower leg: No edema.     Left lower leg: No edema.  Lymphadenopathy:     Cervical: No cervical adenopathy.  Skin:    General: Skin is warm and dry.     Capillary Refill: Capillary refill takes less than 2 seconds.     Coloration: Skin is not cyanotic, jaundiced or pale.     Findings: No rash.  Neurological:     General: No focal deficit present.     Mental Status: He is alert and oriented to person, place, and time.     Cranial Nerves: Cranial nerves are intact. No cranial nerve deficit.     Sensory: Sensation is intact. No sensory deficit.     Motor: Motor function is intact. No weakness.     Coordination: Coordination is intact. Coordination normal.     Gait: Gait is intact. Gait normal.     Deep Tendon Reflexes: Reflexes are normal and symmetric. Reflexes normal.  Psychiatric:        Attention and Perception:  Attention and perception normal.        Mood and Affect: Mood and affect normal.        Speech: Speech normal.        Behavior: Behavior normal. Behavior is cooperative.        Thought Content: Thought content normal.        Cognition and Memory: Cognition and memory normal.        Judgment: Judgment normal.    Results for orders placed or performed during the hospital encounter of 10/02/18  CBC WITH DIFFERENTIAL  Result Value Ref Range   WBC 6.9 4.0 - 10.5 K/uL   RBC 4.90 4.22 - 5.81 MIL/uL   Hemoglobin 14.1 13.0 - 17.0 g/dL   HCT 44.0 39.0 - 52.0 %   MCV 89.8 80.0 - 100.0 fL   MCH 28.8 26.0 - 34.0 pg   MCHC 32.0 30.0 - 36.0 g/dL   RDW 13.0 11.5 - 15.5 %   Platelets 323 150 - 400 K/uL   nRBC 0.0 0.0 - 0.2 %   Neutrophils Relative % 45 %   Neutro Abs 3.1 1.7 - 7.7 K/uL   Lymphocytes Relative 45 %   Lymphs Abs 3.1 0.7 - 4.0 K/uL   Monocytes Relative 8 %   Monocytes Absolute 0.5 0.1 - 1.0 K/uL   Eosinophils Relative 1 %   Eosinophils Absolute 0.1 0.0 - 0.5 K/uL   Basophils Relative 1 %   Basophils Absolute 0.1 0.0 - 0.1 K/uL   Immature Granulocytes 0 %   Abs Immature Granulocytes 0.01 0.00 - 0.07 K/uL  Comprehensive metabolic panel  Result Value Ref Range   Sodium 142 135 - 145 mmol/L  Potassium 4.5 3.5 - 5.1 mmol/L   Chloride 108 98 - 111 mmol/L   CO2 27 22 - 32 mmol/L   Glucose, Bld 93 70 - 99 mg/dL   BUN 16 6 - 20 mg/dL   Creatinine, Ser 1.00 0.61 - 1.24 mg/dL   Calcium 9.1 8.9 - 10.3 mg/dL   Total Protein 7.6 6.5 - 8.1 g/dL   Albumin 4.1 3.5 - 5.0 g/dL   AST 18 15 - 41 U/L   ALT 24 0 - 44 U/L   Alkaline Phosphatase 89 38 - 126 U/L   Total Bilirubin 0.7 0.3 - 1.2 mg/dL   GFR calc non Af Amer >60 >60 mL/min   GFR calc Af Amer >60 >60 mL/min   Anion gap 7 5 - 15     X-Ray: lumbar spine: No acute findings. Preliminary x-ray reading by Monia Pouch, FNP-C, [facility].   Pertinent labs & imaging results that were available during my care of the patient were reviewed  by me and considered in my medical decision making.  Assessment & Plan:  Jim was seen today for numbness.  Diagnoses and all orders for this visit:  Chronic midline low back pain with bilateral sciatica Imaging without acute findings. No red flags concerning for cauda equina syndrome. Will burst with steroids in office. Symptomatic care discussed in detail.  -     DG Lumbar Spine 2-3 Views; Future -     methylPREDNISolone acetate (DEPO-MEDROL) injection 60 mg  Paresthesia of bilateral legs Imaging without acute findings. No red flags concerning for cauda equina syndrome. Will burst with steroids in office. Symptomatic care discussed in detail. Will check for underlying causes such as B12 deficiency. Pt aware to report any new or worsening symptoms.  -     DG Lumbar Spine 2-3 Views; Future -     Vitamin B12 -     CBC with Differential/Platelet -     BMP8+EGFR -     methylPREDNISolone acetate (DEPO-MEDROL) injection 60 mg    Continue all other maintenance medications.  Follow up plan: Return if symptoms worsen or fail to improve.  Educational handout given for paresthesia  The above assessment and management plan was discussed with the patient. The patient verbalized understanding of and has agreed to the management plan. Patient is aware to call the clinic if they develop any new symptoms or if symptoms persist or worsen. Patient is aware when to return to the clinic for a follow-up visit. Patient educated on when it is appropriate to go to the emergency department.   Monia Pouch, FNP-C [facility] [phone]

## 2021-01-10 LAB — BMP8+EGFR
BUN/Creatinine Ratio: 19 (ref 10–24)
BUN: 21 mg/dL (ref 8–27)
CO2: 22 mmol/L (ref 20–29)
Calcium: 9.7 mg/dL (ref 8.6–10.2)
Chloride: 100 mmol/L (ref 96–106)
Creatinine, Ser: 1.12 mg/dL (ref 0.76–1.27)
Glucose: 101 mg/dL — ABNORMAL HIGH (ref 65–99)
Potassium: 4.7 mmol/L (ref 3.5–5.2)
Sodium: 140 mmol/L (ref 134–144)
eGFR: 75 mL/min/{1.73_m2} (ref >59)

## 2021-01-10 LAB — CBC WITH DIFFERENTIAL/PLATELET
Basophils Absolute: 0.1 10*3/uL (ref 0.0–0.2)
Basos: 1 %
EOS (ABSOLUTE): 0.1 10*3/uL (ref 0.0–0.4)
Eos: 1 %
Hematocrit: 42.9 % (ref 37.5–51.0)
Hemoglobin: 14.4 g/dL (ref 13.0–17.7)
Immature Grans (Abs): 0 10*3/uL (ref 0.0–0.1)
Immature Granulocytes: 0 %
Lymphocytes Absolute: 3.2 10*3/uL — ABNORMAL HIGH (ref 0.7–3.1)
Lymphs: 42 %
MCH: 29.1 pg (ref 26.6–33.0)
MCHC: 33.6 g/dL (ref 31.5–35.7)
MCV: 87 fL (ref 79–97)
Monocytes Absolute: 0.6 10*3/uL (ref 0.1–0.9)
Monocytes: 8 %
Neutrophils Absolute: 3.7 10*3/uL (ref 1.4–7.0)
Neutrophils: 48 %
Platelets: 329 10*3/uL (ref 150–450)
RBC: 4.94 x10E6/uL (ref 4.14–5.80)
RDW: 13.4 % (ref 11.6–15.4)
WBC: 7.6 10*3/uL (ref 3.4–10.8)

## 2021-01-10 LAB — VITAMIN B12: Vitamin B-12: 455 pg/mL (ref 232–1245)

## 2021-01-12 NOTE — Progress Notes (Signed)
R/c labs °

## 2021-02-28 DIAGNOSIS — Z23 Encounter for immunization: Secondary | ICD-10-CM | POA: Diagnosis not present

## 2022-03-04 DIAGNOSIS — Z23 Encounter for immunization: Secondary | ICD-10-CM | POA: Diagnosis not present

## 2022-03-11 IMAGING — DX DG LUMBAR SPINE 2-3V
2 series · 2 of 2 positions shown · non-contrast
Comparison: None.

CLINICAL DATA: Back pain

EXAM:
LUMBAR SPINE - 2-3 VIEW

[l-spine ap]
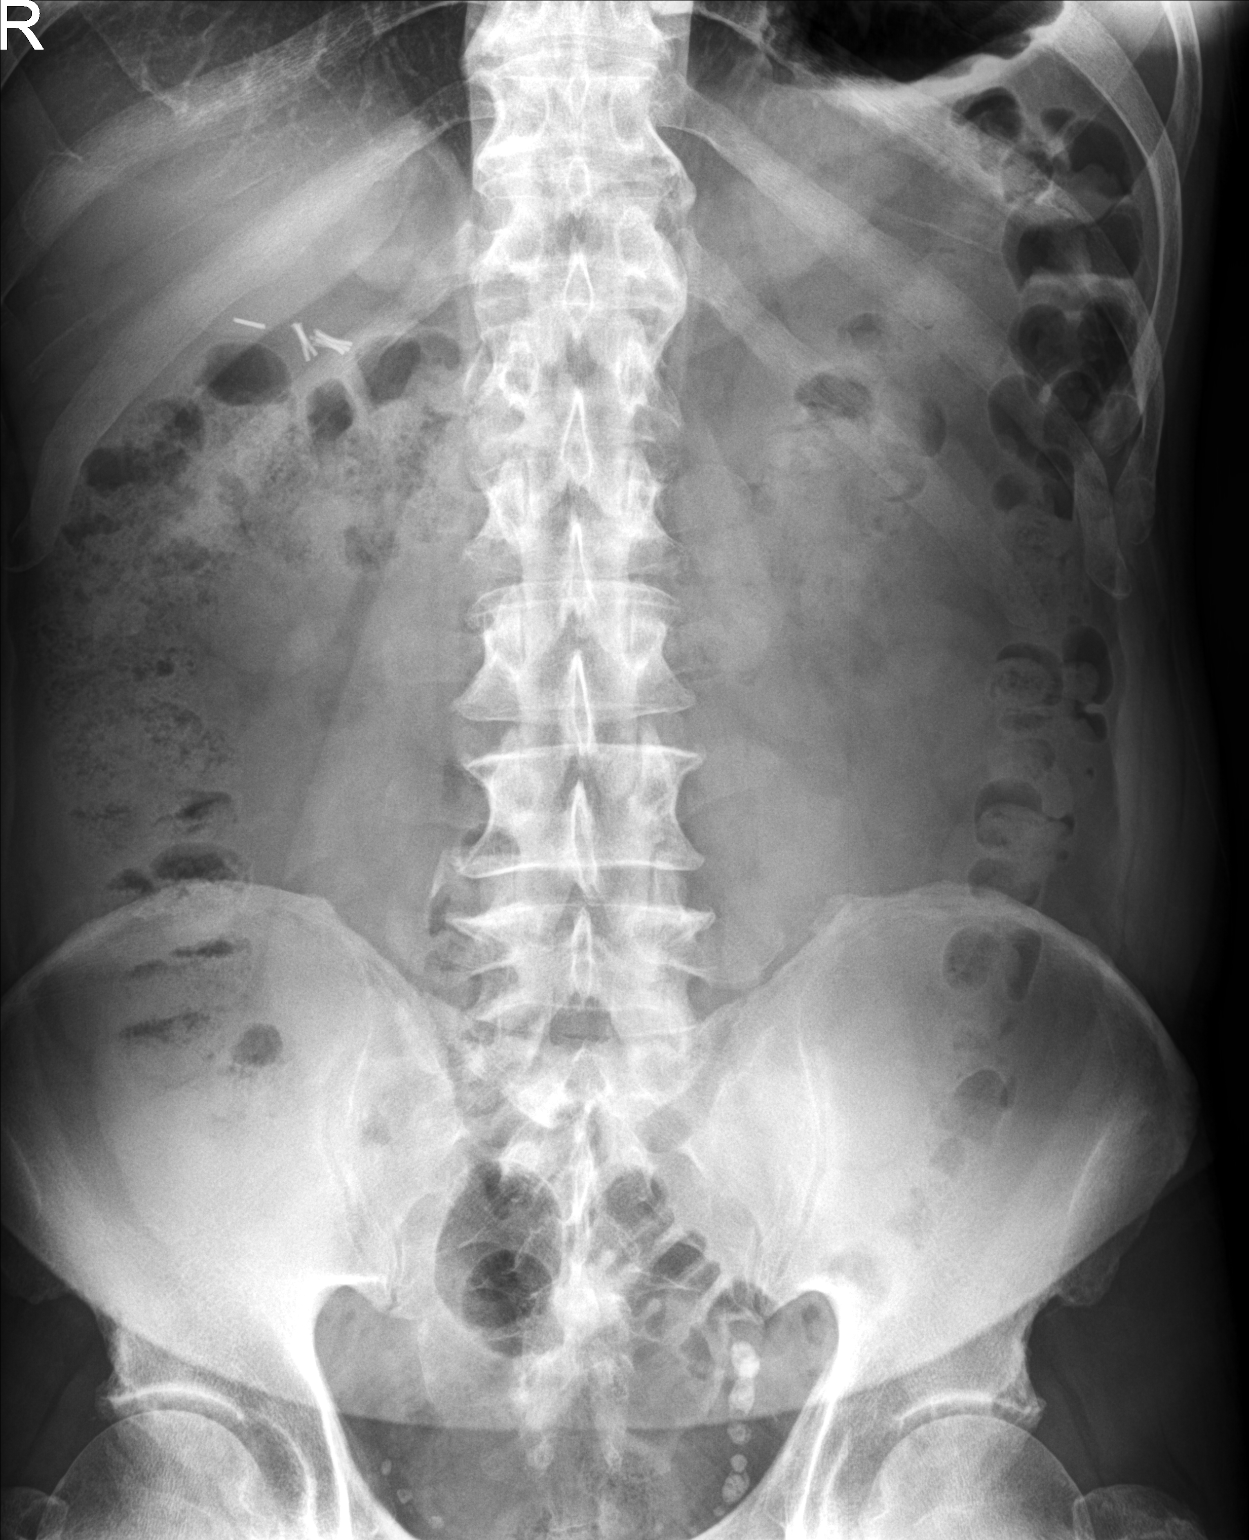

[l-spine lat]
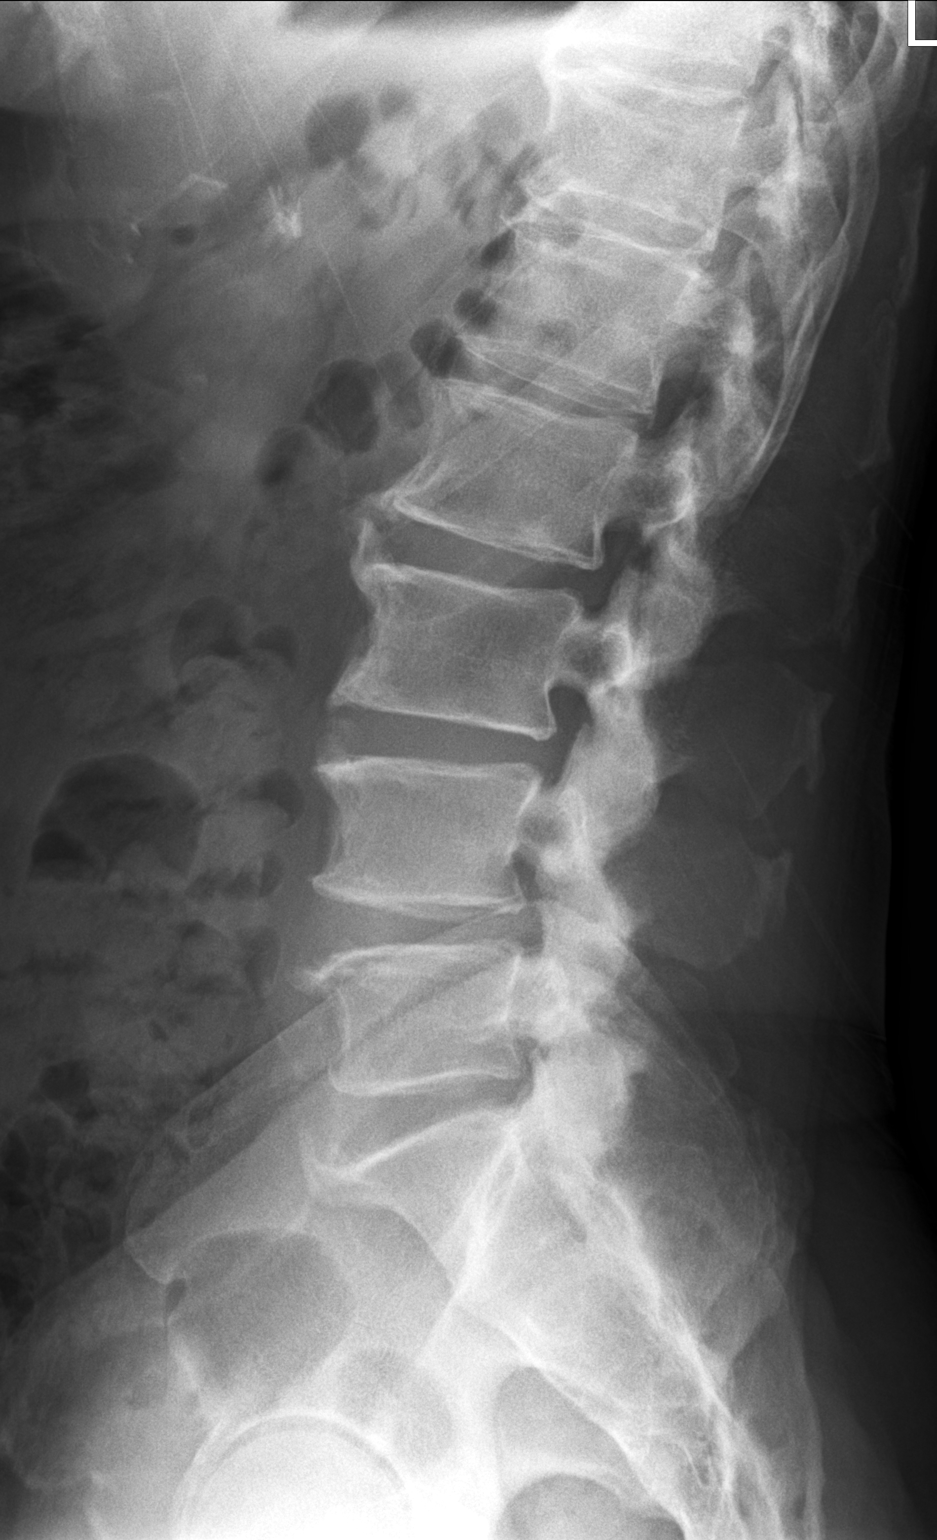

[2 of 2 positions shown; findings below may reference images not displayed]

FINDINGS: Normal lumbar lordosis. No acute fracture or listhesis of the lumbar
spine. Vertebral body height has been preserved. Endplate remodeling
throughout the lumbar spine is in keeping with changes of mild
degenerative disc disease, most severe at L2-3. The paraspinal soft
tissues are unremarkable.
IMPRESSION: Mild diffuse degenerative disc disease. No acute fracture or
listhesis.

## 2022-07-11 ENCOUNTER — Ambulatory Visit (INDEPENDENT_AMBULATORY_CARE_PROVIDER_SITE_OTHER): Payer: BC Managed Care – PPO

## 2022-07-11 ENCOUNTER — Ambulatory Visit (INDEPENDENT_AMBULATORY_CARE_PROVIDER_SITE_OTHER): Payer: BC Managed Care – PPO | Admitting: Orthopaedic Surgery

## 2022-07-11 DIAGNOSIS — M545 Low back pain, unspecified: Secondary | ICD-10-CM

## 2022-07-11 DIAGNOSIS — G8929 Other chronic pain: Secondary | ICD-10-CM

## 2022-07-12 NOTE — Progress Notes (Signed)
Office Visit Note   Patient: Carl Barton           Date of Birth: August 21, 1960           MRN: [id number] Visit Date: 07/11/2022              Requested by: Baruch Gouty, [place],  [place] [address] PCP: Baruch Gouty, FNP   Assessment & Plan: Visit Diagnoses:  1. Chronic bilateral low back pain, unspecified whether sciatica present     Plan: Will proceed with physical therapy in [place].  Recheck 5 to 6 weeks for degenerative disc we can consider diagnostic imaging studies.  He likely has some disc bulge with foraminal stenosis.  All of his symptoms are 7 with therapy.  Follow-Up Instructions: Return in about 6 weeks (around 08/22/2022).   Orders:  Orders Placed This Encounter  Procedures   XR Pelvis 1-2 Views   XR Lumbar Spine 2-3 Views   Ambulatory referral to Physical Therapy   No orders of the defined types were placed in this encounter.     Procedures: No procedures performed   Clinical Data: No additional findings.   Subjective: Chief Complaint  Patient presents with   Lower Back - Pain    HPI 62 year old male with chronic back pain for several years radiates into his buttocks with tingling and burning.  He is deaf walking speed decreased has more numbness right than left.  He has not been through any therapy.  He works to [employer] stands for 8 hours and states it is hard for him to make it through the day.  He is not on medications he is prediabetic has low serum vitamin D history of gallstones and elevated cholesterol.  Review of Systems negative bowel bladder associated symptoms no fever or chills.   Objective: Vital Signs: There were no vitals taken for this visit.  Physical Exam Constitutional:      Appearance: He is well-developed.  HENT:     Head: Normocephalic and atraumatic.     Right Ear: External ear normal.     Left Ear: External ear normal.  Eyes:     Pupils: Pupils are equal, round, and reactive to light.  Neck:      Thyroid: No thyromegaly.     Trachea: No tracheal deviation.  Cardiovascular:     Rate and Rhythm: Normal rate.  Pulmonary:     Effort: Pulmonary effort is normal.     Breath sounds: No wheezing.  Abdominal:     General: Bowel sounds are normal.     Palpations: Abdomen is soft.  Musculoskeletal:     Cervical back: Neck supple.  Skin:    General: Skin is warm and dry.     Capillary Refill: Capillary refill takes less than 2 seconds.  Neurological:     Mental Status: He is alert and oriented to person, place, and time.  Psychiatric:        Behavior: Behavior normal.        Thought Content: Thought content normal.        Judgment: Judgment normal.     Ortho Exam patient can heel and toe walking straight leg raising 90 degrees no sensory deficit.  Peroneals are strong good quad strength negative logroll hips.  Specialty Comments:  No specialty comments available.  Imaging: No results found.   PMFS History: Patient Active Problem List   Diagnosis Date Noted   Follow-up examination following surgery 10/15/2018  Calculus of gallbladder without cholecystitis without obstruction    Low serum vitamin D 02/18/2018   Prediabetes 02/18/2018   Hypothyroidism 07/14/2015   Mixed hyperlipidemia 07/14/2015   Past Medical History:  Diagnosis Date   Chicken pox    Hyperlipidemia    Hypothyroidism    Thyroid disease     Family History  Problem Relation Age of Onset   Arthritis Mother    Cancer Father        bone   Cancer Maternal Grandfather    Asthma Paternal Grandmother    Diabetes Paternal Grandfather     Past Surgical History:  Procedure Laterality Date   CHOLECYSTECTOMY N/A 10/07/2018   Procedure: LAPAROSCOPIC CHOLECYSTECTOMY;  Surgeon: Aviva Signs, MD;  Location: [facility];  Service: General;  Laterality: N/A;   never     Social History   Occupational History   Not on file  Tobacco Use   Smoking status: Former    Packs/day: 1.00    Years: 15.00    Total  pack years: 15.00    Types: Cigarettes    Quit date: 05/20/1997    Years since quitting: 25.1   Smokeless tobacco: Never  Vaping Use   Vaping Use: Never used  Substance and Sexual Activity   Alcohol use: No    Alcohol/week: 0.0 standard drinks of alcohol   Drug use: No   Sexual activity: Yes

## 2022-07-25 ENCOUNTER — Telehealth: Payer: Self-pay | Admitting: Radiology

## 2022-07-25 NOTE — Telephone Encounter (Signed)
Patient called [facility] office and states that he was supposed to be referred to PT in [place] but no one has called him. He requests call after 3:30pm to advise.  From chart, it appears PT has attempted to reach patient and was unable to leave message. Will call patient after 3:30 today and give number to PT in [place] so that he can call and schedule.

## 2022-08-06 ENCOUNTER — Ambulatory Visit: Payer: BC Managed Care – PPO | Attending: Orthopaedic Surgery

## 2022-08-06 ENCOUNTER — Other Ambulatory Visit: Payer: Self-pay

## 2022-08-06 DIAGNOSIS — G8929 Other chronic pain: Secondary | ICD-10-CM | POA: Diagnosis not present

## 2022-08-06 DIAGNOSIS — M5416 Radiculopathy, lumbar region: Secondary | ICD-10-CM | POA: Insufficient documentation

## 2022-08-06 DIAGNOSIS — M545 Low back pain, unspecified: Secondary | ICD-10-CM | POA: Insufficient documentation

## 2022-08-06 NOTE — Therapy (Signed)
OUTPATIENT PHYSICAL THERAPY THORACOLUMBAR EVALUATION   Patient Name: Carl Barton MRN: [id number] DOB:09-23-1960, 62 y.o., male Today's Date: 08/06/2022  END OF SESSION:  PT End of Session - 08/06/22 1509     Visit Number 1    Number of Visits 3    Date for PT Re-Evaluation 08/23/22    PT Start Time 1520    PT Stop Time 1547    PT Time Calculation (min) 27 min    Activity Tolerance Patient tolerated treatment well    Behavior During Therapy [facility] for tasks assessed/performed             Past Medical History:  Diagnosis Date   Chicken pox    Hyperlipidemia    Hypothyroidism    Thyroid disease    Past Surgical History:  Procedure Laterality Date   CHOLECYSTECTOMY N/A 10/07/2018   Procedure: LAPAROSCOPIC CHOLECYSTECTOMY;  Surgeon: Aviva Signs, MD;  Location: [facility];  Service: General;  Laterality: N/A;   never     Patient Active Problem List   Diagnosis Date Noted   Follow-up examination following surgery 10/15/2018   Calculus of gallbladder without cholecystitis without obstruction    Low serum vitamin D 02/18/2018   Prediabetes 02/18/2018   Hypothyroidism 07/14/2015   Mixed hyperlipidemia 07/14/2015    PCP: Baruch Gouty, FNP  REFERRING PROVIDER: Marybelle Killings, MD   REFERRING DIAG: Chronic bilateral low back pain, unspecified whether sciatica present   Rationale for Evaluation and Treatment: Rehabilitation  THERAPY DIAG:  Radiculopathy, lumbar region  ONSET DATE: 2 years ago   SUBJECTIVE:                                                                                                                                                                                           SUBJECTIVE STATEMENT: Patient reports that his back has been bothering him for about 2 years. He notes that there was no mechanism of injury. He has begun having some burning and tingling going down both legs to both feet. He has noticed that his left leg is worse than the left  as it stays numb all the time. His pain has gotten so bad that he has needed to sit down on the floor at work.   PERTINENT HISTORY:  Prediabetes  PAIN:  Are you having pain? Yes: NPRS scale: 4/10 Pain location: back and both legs  Pain description: constant tingling and burning Aggravating factors: standing and walking (5 minutes)  Relieving factors: sitting and bending over  PRECAUTIONS: None  WEIGHT BEARING RESTRICTIONS: No  FALLS:  Has patient fallen in last 6 months? No  LIVING  ENVIRONMENT: Lives with: lives with their family Lives in: House/apartment Stairs: No Has following equipment at home: None  OCCUPATION: Clinical biochemist; full time  PLOF: Independent  PATIENT GOALS: get a MRI and reduced burning and tingling   NEXT MD VISIT: 08/22/22  OBJECTIVE:   SCREENING FOR RED FLAGS: Bowel or bladder incontinence: No Spinal tumors: No Cauda equina syndrome: No Compression fracture: No Abdominal aneurysm: No  COGNITION: Overall cognitive status: Within functional limits for tasks assessed     SENSATION: Patient reports no noticeable numbness or tingling when sitting still, but tingling progressively goes down his leg as he stands and walks.  POSTURE: flexed trunk   PALPATION: No reported tenderness to palpation   LUMBAR JOINT MOBILITY:   Hypomobile and painful throughout  LUMBAR ROM:   AROM eval  Flexion 60  Extension 10  Right lateral flexion 50% limited   Left lateral flexion 50% limited  Right rotation 25% limited; slow movement  Left rotation 25% limited; slow movement   (Blank rows = not tested)  LOWER EXTREMITY ROM: WFL for activities assessed   LOWER EXTREMITY MMT:    MMT Right eval Left eval  Hip flexion 4+/5 4+/5  Hip extension    Hip abduction    Hip adduction    Hip internal rotation    Hip external rotation    Knee flexion 4+/5 5/5  Knee extension 5/5 5/5  Ankle dorsiflexion 4+/5 4+/5  Ankle plantarflexion    Ankle inversion     Ankle eversion     (Blank rows = not tested)  LUMBAR SPECIAL TESTS:  Straight leg raise test: Negative and FABER test: Negative  GAIT: Assistive device utilized: None Level of assistance: Complete Independence Comments: no significant gait deviations   TODAY'S TREATMENT:                                                                                                                              DATE:     PATIENT EDUCATION:  Education details: Plan of care, goals for therapy, healing, and objective findings Person educated: Patient Education method: Explanation Education comprehension: verbalized understanding  HOME EXERCISE PROGRAM:   ASSESSMENT:  CLINICAL IMPRESSION: Patient is a 62 y.o. male who was seen today for physical therapy evaluation and treatment for lumbar radiculopathy. He presented with low pain severity and irritability with none of today's assessments significantly reproducing his familiar symptoms. He exhibited reduced lumbar active range of motion. Recommend that he continue with skilled physical therapy to address impairments to return to his prior level of function.  OBJECTIVE IMPAIRMENTS: decreased activity tolerance, decreased mobility, difficulty walking, decreased ROM, decreased strength, hypomobility, impaired sensation, postural dysfunction, and pain.   ACTIVITY LIMITATIONS: lifting, standing, and locomotion level  PARTICIPATION LIMITATIONS: community activity and occupation  PERSONAL FACTORS: Time since onset of injury/illness/exacerbation and 1 comorbidity: prediabetes  are also affecting patient's functional outcome.   REHAB POTENTIAL: Fair    CLINICAL DECISION MAKING: Stable/uncomplicated  EVALUATION COMPLEXITY: Low   GOALS: Goals reviewed with patient? Yes  LONG TERM GOALS: Target date: 08/27/22  Patient will be independent with his HEP. Baseline:  Goal status: INITIAL  2.  Patient will be able to complete his daily activities without  his familiar symptoms exceeding 2/10. Baseline:  Goal status: INITIAL  3.  Patient will be able to stand for at least 15 minutes without being limited by his familiar symptoms for improved function with his daily activities. Baseline:  Goal status: INITIAL  4.  Patient will be able to demonstrate at least 15 degrees of lumbar extension for improved lumbar mobility. Baseline:  Goal status: INITIAL  PLAN:  PT FREQUENCY: 1x/week  PT DURATION: 3 weeks  PLANNED INTERVENTIONS: Therapeutic exercises, Therapeutic activity, Neuromuscular re-education, Gait training, Patient/Family education, Self Care, Joint mobilization, Stair training, Electrical stimulation, Spinal mobilization, Cryotherapy, Moist heat, Traction, Manual therapy, and Re-evaluation.  PLAN FOR NEXT SESSION: SKC, DKC, bridges, SLR, lumbar stabilization, and modalities as needed   Darlin Coco, PT 08/06/2022, 6:20 PM

## 2022-08-13 ENCOUNTER — Ambulatory Visit: Payer: BC Managed Care – PPO

## 2022-08-13 DIAGNOSIS — G8929 Other chronic pain: Secondary | ICD-10-CM | POA: Diagnosis not present

## 2022-08-13 DIAGNOSIS — M5416 Radiculopathy, lumbar region: Secondary | ICD-10-CM | POA: Diagnosis not present

## 2022-08-13 DIAGNOSIS — M545 Low back pain, unspecified: Secondary | ICD-10-CM | POA: Diagnosis not present

## 2022-08-13 NOTE — Therapy (Signed)
OUTPATIENT PHYSICAL THERAPY THORACOLUMBAR EVALUATION   Patient Name: Carl Barton MRN: [id number] DOB:1961-04-02, 62 y.o., male Today's Date: 08/13/2022  END OF SESSION:  PT End of Session - 08/13/22 1512     Visit Number 2    Number of Visits 3    Date for PT Re-Evaluation 08/23/22    PT Start Time 1512    PT Stop Time [id number]    PT Time Calculation (min) 32 min    Activity Tolerance Patient tolerated treatment well    Behavior During Therapy [facility] for tasks assessed/performed              Past Medical History:  Diagnosis Date   Chicken pox    Hyperlipidemia    Hypothyroidism    Thyroid disease    Past Surgical History:  Procedure Laterality Date   CHOLECYSTECTOMY N/A 10/07/2018   Procedure: LAPAROSCOPIC CHOLECYSTECTOMY;  Surgeon: Aviva Signs, MD;  Location: [facility];  Service: General;  Laterality: N/A;   never     Patient Active Problem List   Diagnosis Date Noted   Follow-up examination following surgery 10/15/2018   Calculus of gallbladder without cholecystitis without obstruction    Low serum vitamin D 02/18/2018   Prediabetes 02/18/2018   Hypothyroidism 07/14/2015   Mixed hyperlipidemia 07/14/2015    PCP: Baruch Gouty, FNP  REFERRING PROVIDER: Marybelle Killings, MD   REFERRING DIAG: Chronic bilateral low back pain, unspecified whether sciatica present   Rationale for Evaluation and Treatment: Rehabilitation  THERAPY DIAG:  Radiculopathy, lumbar region  ONSET DATE: 2 years ago   SUBJECTIVE:                                                                                                                                                                                           SUBJECTIVE STATEMENT: Patient reports that he feels about the same today. He still has some numbness going all the way down into his foot.   PERTINENT HISTORY:  Prediabetes  PAIN:  Are you having pain? Yes: NPRS scale: 4/10 Pain location: back and both legs  Pain  description: constant tingling and burning Aggravating factors: standing and walking (5 minutes)  Relieving factors: sitting and bending over  PRECAUTIONS: None  WEIGHT BEARING RESTRICTIONS: No  FALLS:  Has patient fallen in last 6 months? No  LIVING ENVIRONMENT: Lives with: lives with their family Lives in: House/apartment Stairs: No Has following equipment at home: None  OCCUPATION: Clinical biochemist; full time  PLOF: Independent  PATIENT GOALS: get a MRI and reduced burning and tingling   NEXT MD VISIT: 08/22/22  OBJECTIVE:   SCREENING FOR RED FLAGS:  Bowel or bladder incontinence: No Spinal tumors: No Cauda equina syndrome: No Compression fracture: No Abdominal aneurysm: No  COGNITION: Overall cognitive status: Within functional limits for tasks assessed     SENSATION: Patient reports no noticeable numbness or tingling when sitting still, but tingling progressively goes down his leg as he stands and walks.  POSTURE: flexed trunk   PALPATION: No reported tenderness to palpation   LUMBAR JOINT MOBILITY:   Hypomobile and painful throughout  LUMBAR ROM:   AROM eval  Flexion 60  Extension 10  Right lateral flexion 50% limited   Left lateral flexion 50% limited  Right rotation 25% limited; slow movement  Left rotation 25% limited; slow movement   (Blank rows = not tested)  LOWER EXTREMITY ROM: WFL for activities assessed   LOWER EXTREMITY MMT:    MMT Right eval Left eval  Hip flexion 4+/5 4+/5  Hip extension    Hip abduction    Hip adduction    Hip internal rotation    Hip external rotation    Knee flexion 4+/5 5/5  Knee extension 5/5 5/5  Ankle dorsiflexion 4+/5 4+/5  Ankle plantarflexion    Ankle inversion    Ankle eversion     (Blank rows = not tested)  LUMBAR SPECIAL TESTS:  Straight leg raise test: Negative and FABER test: Negative  GAIT: Assistive device utilized: None Level of assistance: Complete Independence Comments: no significant  gait deviations   TODAY'S TREATMENT:                                                                                                                              DATE:                                     3/26 EXERCISE LOG  Exercise Repetitions and Resistance Comments  LTR 2 minutes   Double knee to chest  3 minutes  LE supported on red ball   Single knee to chest  3 x 30 seconds each    Bridge  20 reps    SLR  20 reps each    Sciatic nerve glide  20 reps each    Supine clams  Green t-band x 25 reps    Supine hip ADD isometric  2 minutes w/ 5 second hold     Blank cell = exercise not performed today   PATIENT EDUCATION:  Education details: Plan of care, goals for therapy, healing, and objective findings Person educated: Patient Education method: Explanation Education comprehension: verbalized understanding  HOME EXERCISE PROGRAM: [id number]  ASSESSMENT:  CLINICAL IMPRESSION: Patient was introduced to multiple new supine interventions for improved lumbar mobility and strength. He required minimal cueing with today's new interventions for proper exercise performance. He reported no increase in pain with any of today's interventions. He reported that his numbness was slightly less after today's supine interventions.  He was provided an updated HEP and he reported feeling comfortable with these interventions. He continues to require skilled physical therapy to address his remaining impairments to maximize his functional mobility.   OBJECTIVE IMPAIRMENTS: decreased activity tolerance, decreased mobility, difficulty walking, decreased ROM, decreased strength, hypomobility, impaired sensation, postural dysfunction, and pain.   ACTIVITY LIMITATIONS: lifting, standing, and locomotion level  PARTICIPATION LIMITATIONS: community activity and occupation  PERSONAL FACTORS: Time since onset of injury/illness/exacerbation and 1 comorbidity: prediabetes  are also affecting patient's functional  outcome.   REHAB POTENTIAL: Fair    CLINICAL DECISION MAKING: Stable/uncomplicated  EVALUATION COMPLEXITY: Low   GOALS: Goals reviewed with patient? Yes  LONG TERM GOALS: Target date: 08/27/22  Patient will be independent with his HEP. Baseline:  Goal status: INITIAL  2.  Patient will be able to complete his daily activities without his familiar symptoms exceeding 2/10. Baseline:  Goal status: INITIAL  3.  Patient will be able to stand for at least 15 minutes without being limited by his familiar symptoms for improved function with his daily activities. Baseline:  Goal status: INITIAL  4.  Patient will be able to demonstrate at least 15 degrees of lumbar extension for improved lumbar mobility. Baseline:  Goal status: INITIAL  PLAN:  PT FREQUENCY: 1x/week  PT DURATION: 3 weeks  PLANNED INTERVENTIONS: Therapeutic exercises, Therapeutic activity, Neuromuscular re-education, Gait training, Patient/Family education, Self Care, Joint mobilization, Stair training, Electrical stimulation, Spinal mobilization, Cryotherapy, Moist heat, Traction, Manual therapy, and Re-evaluation.  PLAN FOR NEXT SESSION: SKC, DKC, bridges, SLR, lumbar stabilization, and modalities as needed   Darlin Coco, PT 08/13/2022, 6:03 PM

## 2022-08-20 ENCOUNTER — Ambulatory Visit: Payer: BC Managed Care – PPO | Attending: Orthopaedic Surgery

## 2022-08-20 DIAGNOSIS — M5416 Radiculopathy, lumbar region: Secondary | ICD-10-CM | POA: Insufficient documentation

## 2022-08-20 NOTE — Therapy (Addendum)
OUTPATIENT PHYSICAL THERAPY THORACOLUMBAR TREATMENT   Patient Name: Mucad Meloni MRN: [id number] DOB:1960/10/16, 62 y.o., male Today's Date: 08/20/2022  END OF SESSION:  PT End of Session - 08/20/22 1526     Visit Number 3    Number of Visits 3    Date for PT Re-Evaluation 08/23/22    PT Start Time 1515    PT Stop Time 1526   Patient requested to leave early.   PT Time Calculation (min) 11 min    Activity Tolerance Patient tolerated treatment well    Behavior During Therapy WFL for tasks assessed/performed              Past Medical History:  Diagnosis Date   Chicken pox    Hyperlipidemia    Hypothyroidism    Thyroid disease    Past Surgical History:  Procedure Laterality Date   CHOLECYSTECTOMY N/A 10/07/2018   Procedure: LAPAROSCOPIC CHOLECYSTECTOMY;  Surgeon: Franky Macho, MD;  Location: [facility];  Service: General;  Laterality: N/A;   never     Patient Active Problem List   Diagnosis Date Noted   Follow-up examination following surgery 10/15/2018   Calculus of gallbladder without cholecystitis without obstruction    Low serum vitamin D 02/18/2018   Prediabetes 02/18/2018   Hypothyroidism 07/14/2015   Mixed hyperlipidemia 07/14/2015    PCP: Sonny Masters, FNP  REFERRING PROVIDER: Eldred Manges, MD   REFERRING DIAG: Chronic bilateral low back pain, unspecified whether sciatica present   Rationale for Evaluation and Treatment: Rehabilitation  THERAPY DIAG:  Radiculopathy, lumbar region  ONSET DATE: 2 years ago   SUBJECTIVE:                                                                                                                                                                                           SUBJECTIVE STATEMENT: Patient reports that his pain is about the same as it has been. He has not noticed a change with his HEP.   PERTINENT HISTORY:  Prediabetes  PAIN:  Are you having pain? Yes: NPRS scale: 4/10 Pain location: back and  both legs  Pain description: constant tingling and burning Aggravating factors: standing and walking (5 minutes)  Relieving factors: sitting and bending over  PRECAUTIONS: None  WEIGHT BEARING RESTRICTIONS: No  FALLS:  Has patient fallen in last 6 months? No  LIVING ENVIRONMENT: Lives with: lives with their family Lives in: House/apartment Stairs: No Has following equipment at home: None  OCCUPATION: Personnel officer; full time  PLOF: Independent  PATIENT GOALS: get a MRI and reduced burning and tingling   NEXT MD VISIT: 08/22/22  OBJECTIVE:  SCREENING FOR RED FLAGS: Bowel or bladder incontinence: No Spinal tumors: No Cauda equina syndrome: No Compression fracture: No Abdominal aneurysm: No  COGNITION: Overall cognitive status: Within functional limits for tasks assessed     SENSATION: Patient reports no noticeable numbness or tingling when sitting still, but tingling progressively goes down his leg as he stands and walks.  POSTURE: flexed trunk   PALPATION: No reported tenderness to palpation   LUMBAR JOINT MOBILITY:   Hypomobile and painful throughout  LUMBAR ROM:   AROM eval 08/20/22 progress report  Flexion 60 62  Extension 10 16  Right lateral flexion 50% limited  50% limited  Left lateral flexion 50% limited 50% limited  Right rotation 25% limited; slow movement 25% limited  Left rotation 25% limited; slow movement 25% limited   (Blank rows = not tested)  LOWER EXTREMITY ROM: WFL for activities assessed   LOWER EXTREMITY MMT:    MMT Right eval Left eval  Hip flexion 4+/5 4+/5  Hip extension    Hip abduction    Hip adduction    Hip internal rotation    Hip external rotation    Knee flexion 4+/5 5/5  Knee extension 5/5 5/5  Ankle dorsiflexion 4+/5 4+/5  Ankle plantarflexion    Ankle inversion    Ankle eversion     (Blank rows = not tested)  LUMBAR SPECIAL TESTS:  Straight leg raise test: Negative and FABER test:  Negative  GAIT: Assistive device utilized: None Level of assistance: Complete Independence Comments: no significant gait deviations   TODAY'S TREATMENT:                                                                                                                              DATE:  08/20/22 Patient requested for his progress to be assessed and be placed on hold until he sees his physician on 08/22/22  PATIENT EDUCATION:  Education details: Plan of care, goals for therapy, healing, and objective findings Person educated: Patient Education method: Explanation Education comprehension: verbalized understanding  HOME EXERCISE PROGRAM: [id number]  ASSESSMENT:  CLINICAL IMPRESSION: Patient has made minimal progress with skilled physical therapy as evidenced by his subjective reports, objective measures, functional mobility, and progress toward his goals. He reported that he was able to be on his feel longer and he was able to demonstrate a slight improvement in lumbar AROM. However, he continues to experience increased pain with functional activities. He may benefit from additional medical intervention to address his impairments to maximize his functional mobility.   PHYSICAL THERAPY DISCHARGE SUMMARY  Visits from Start of Care: 3  Current functional level related to goals / functional outcomes: Patient was able to partially meet his goals for physical therapy.    Remaining deficits: Pain    Education / Equipment: HEP    Patient agrees to discharge. Patient goals were partially met. Patient is being discharged due to the patient's request.  Candi Leash, PT, DPT  OBJECTIVE IMPAIRMENTS: decreased activity tolerance, decreased mobility, difficulty walking, decreased ROM, decreased strength, hypomobility, impaired sensation, postural dysfunction, and pain.   ACTIVITY LIMITATIONS: lifting, standing, and locomotion level  PARTICIPATION LIMITATIONS: community activity and  occupation  PERSONAL FACTORS: Time since onset of injury/illness/exacerbation and 1 comorbidity: prediabetes  are also affecting patient's functional outcome.   REHAB POTENTIAL: Fair    CLINICAL DECISION MAKING: Stable/uncomplicated  EVALUATION COMPLEXITY: Low   GOALS: Goals reviewed with patient? Yes  LONG TERM GOALS: Target date: 08/27/22  Patient will be independent with his HEP. Baseline: at least once per day Goal status: MET  2.  Patient will be able to complete his daily activities without his familiar symptoms exceeding 2/10. Baseline:  Goal status: IN PROGRESS  3.  Patient will be able to stand for at least 15 minutes without being limited by his familiar symptoms for improved function with his daily activities. Baseline: about 20 minutes Goal status: MET  4.  Patient will be able to demonstrate at least 15 degrees of lumbar extension for improved lumbar mobility. Baseline: 16 degrees extension Goal status: MET  PLAN:  PT FREQUENCY: 1x/week  PT DURATION: 3 weeks  PLANNED INTERVENTIONS: Therapeutic exercises, Therapeutic activity, Neuromuscular re-education, Gait training, Patient/Family education, Self Care, Joint mobilization, Stair training, Electrical stimulation, Spinal mobilization, Cryotherapy, Moist heat, Traction, Manual therapy, and Re-evaluation.  PLAN FOR NEXT SESSION: SKC, DKC, bridges, SLR, lumbar stabilization, and modalities as needed   Granville Lewis, PT 08/20/2022, 5:56 PM

## 2022-08-22 ENCOUNTER — Encounter: Payer: Self-pay | Admitting: Orthopaedic Surgery

## 2022-08-22 ENCOUNTER — Ambulatory Visit (INDEPENDENT_AMBULATORY_CARE_PROVIDER_SITE_OTHER): Payer: BC Managed Care – PPO | Admitting: Orthopaedic Surgery

## 2022-08-22 VITALS — Ht 69.0 in | Wt 188.0 lb

## 2022-08-22 DIAGNOSIS — M48061 Spinal stenosis, lumbar region without neurogenic claudication: Secondary | ICD-10-CM | POA: Insufficient documentation

## 2022-08-22 DIAGNOSIS — M48062 Spinal stenosis, lumbar region with neurogenic claudication: Secondary | ICD-10-CM | POA: Diagnosis not present

## 2022-08-22 NOTE — Addendum Note (Signed)
Addended by: Meyer Cory on: 08/22/2022 04:00 PM   Modules accepted: Orders

## 2022-08-22 NOTE — Progress Notes (Signed)
Office Visit Note   Patient: Carl Barton           Date of Birth: [birth year]           MRN: [id number] Visit Date: 08/22/2022              Requested by: Baruch Gouty, [place],  [place] [id number] PCP: Baruch Gouty, FNP   Assessment & Plan: Visit Diagnoses:  1. Spinal stenosis of lumbar region with neurogenic claudication     Plan: Patient with chronic back pain neurogenic claudication symptoms palpable pulses and inability to ambulate 100 feet.  Needs an MRI scan to evaluate him for lumbar spinal stenosis.  I will follow-up after scan for review.  Follow-Up Instructions: No follow-ups on file.   Orders:  No orders of the defined types were placed in this encounter.  No orders of the defined types were placed in this encounter.     Procedures: No procedures performed   Clinical Data: No additional findings.   Subjective: Chief Complaint  Patient presents with   Lower Back - Pain, Follow-up    HPI 62 year old male with chronic back pain returns.  He is having claudication symptoms pain in both legs little bit worse on the left than right.  He can make it about 100 feet asked to stop sit and then can repeat.  Pain with prolonged standing.  He has been through 6 weeks of therapy without improvement.  No improvement with medication, anti-inflammatories.  Review of Systems all systems updated unchanged from 07/11/2022 other than as mentioned above.   Objective: Vital Signs: Ht 5\' 9"  (1.753 m)   Wt 188 lb (85.3 kg)   BMI 27.76 kg/m   Physical Exam Constitutional:      Appearance: He is well-developed.  HENT:     Head: Normocephalic and atraumatic.     Right Ear: External ear normal.     Left Ear: External ear normal.  Eyes:     Pupils: Pupils are equal, round, and reactive to light.  Neck:     Thyroid: No thyromegaly.     Trachea: No tracheal deviation.  Cardiovascular:     Rate and Rhythm: Normal rate.  Pulmonary:     Effort:  Pulmonary effort is normal.     Breath sounds: No wheezing.  Abdominal:     General: Bowel sounds are normal.     Palpations: Abdomen is soft.  Musculoskeletal:     Cervical back: Neck supple.  Skin:    General: Skin is warm and dry.     Capillary Refill: Capillary refill takes less than 2 seconds.  Neurological:     Mental Status: He is alert and oriented to person, place, and time.  Psychiatric:        Behavior: Behavior normal.        Thought Content: Thought content normal.        Judgment: Judgment normal.     Ortho Exam negative straight leg raising no lower extremity atrophy.  Somewhat slow getting up send standing slow deliberate gait.  Good capillary refill.  Specialty Comments:  No specialty comments available.  Imaging: Narrative & Impression  CLINICAL DATA:  Back pain   EXAM: LUMBAR SPINE - 2-3 VIEW   COMPARISON:  None.   FINDINGS: Normal lumbar lordosis. No acute fracture or listhesis of the lumbar spine. Vertebral body height has been preserved. Endplate remodeling throughout the lumbar spine is in keeping with changes  of mild degenerative disc disease, most severe at L2-3. The paraspinal soft tissues are unremarkable.   IMPRESSION: Mild diffuse degenerative disc disease. No acute fracture or listhesis.     Electronically Signed   By: Fidela Salisbury M.D.   On: 01/09/2021 23:56       PMFS History: Patient Active Problem List   Diagnosis Date Noted   Spinal stenosis of lumbar region 08/22/2022   Follow-up examination following surgery 10/15/2018   Calculus of gallbladder without cholecystitis without obstruction    Low serum vitamin D 02/18/2018   Prediabetes 02/18/2018   Hypothyroidism 07/14/2015   Mixed hyperlipidemia 07/14/2015   Past Medical History:  Diagnosis Date   Chicken pox    Hyperlipidemia    Hypothyroidism    Thyroid disease     Family History  Problem Relation Age of Onset   Arthritis Mother    Cancer Father         bone   Cancer Maternal Grandfather    Asthma Paternal Grandmother    Diabetes Paternal Grandfather     Past Surgical History:  Procedure Laterality Date   CHOLECYSTECTOMY N/A 10/07/2018   Procedure: LAPAROSCOPIC CHOLECYSTECTOMY;  Surgeon: Aviva Signs, MD;  Location: [facility];  Service: General;  Laterality: N/A;   never     Social History   Occupational History   Not on file  Tobacco Use   Smoking status: Former    Packs/day: 1.00    Years: 15.00    Additional pack years: 0.00    Total pack years: 15.00    Types: Cigarettes    Quit date: 05/20/1997    Years since quitting: 25.2   Smokeless tobacco: Never  Vaping Use   Vaping Use: Never used  Substance and Sexual Activity   Alcohol use: No    Alcohol/week: 0.0 standard drinks of alcohol   Drug use: No   Sexual activity: Yes

## 2022-09-18 ENCOUNTER — Ambulatory Visit (HOSPITAL_COMMUNITY)
Admission: RE | Admit: 2022-09-18 | Discharge: 2022-09-18 | Disposition: A | Discharge status: Home / Self Care | Payer: BC Managed Care – PPO | Source: Ambulatory Visit | Attending: Orthopaedic Surgery | Admitting: Orthopaedic Surgery

## 2022-09-18 DIAGNOSIS — M47816 Spondylosis without myelopathy or radiculopathy, lumbar region: Secondary | ICD-10-CM | POA: Diagnosis not present

## 2022-09-18 DIAGNOSIS — M48062 Spinal stenosis, lumbar region with neurogenic claudication: Secondary | ICD-10-CM | POA: Insufficient documentation

## 2022-09-18 DIAGNOSIS — M48061 Spinal stenosis, lumbar region without neurogenic claudication: Secondary | ICD-10-CM | POA: Diagnosis not present

## 2022-09-18 DIAGNOSIS — M5126 Other intervertebral disc displacement, lumbar region: Secondary | ICD-10-CM | POA: Diagnosis not present

## 2022-09-26 ENCOUNTER — Other Ambulatory Visit (INDEPENDENT_AMBULATORY_CARE_PROVIDER_SITE_OTHER): Payer: BC Managed Care – PPO

## 2022-09-26 ENCOUNTER — Ambulatory Visit (INDEPENDENT_AMBULATORY_CARE_PROVIDER_SITE_OTHER): Payer: BC Managed Care – PPO | Admitting: Orthopaedic Surgery

## 2022-09-26 ENCOUNTER — Encounter: Payer: Self-pay | Admitting: Orthopaedic Surgery

## 2022-09-26 DIAGNOSIS — M542 Cervicalgia: Secondary | ICD-10-CM

## 2022-09-26 DIAGNOSIS — M48062 Spinal stenosis, lumbar region with neurogenic claudication: Secondary | ICD-10-CM | POA: Diagnosis not present

## 2022-09-26 NOTE — Progress Notes (Addendum)
Office Visit Note   Patient: Carl Barton           Date of Birth: 24-Nov-1960           MRN: [id number] Visit Date: 09/26/2022              Requested by: Sonny Masters, FNP [address] [place],  [place] [address] PCP: Sonny Masters, FNP   Assessment & Plan: Visit Diagnoses:  1. Neck pain   2. Spinal stenosis of lumbar region with neurogenic claudication     Plan: Patient with short pedicles and combination of congenital and acquired severe stenosis 2 levels L3-4 and L4-5.  Plan would be two-level decompression at L3-4 and L4-5 with overnight stay in the hospital.  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  Risk surgery discussed including low risk of infection, bleeding, reoperation, potential for instability possible need for fusion which is low.  Potential for dural tear which might require dural repair.  Questions elicited and answered he understands and requests we proceed.  Follow-Up Instructions: No follow-ups on file.   Orders:  Orders Placed This Encounter  Procedures   XR Cervical Spine 2 or 3 views   No orders of the defined types were placed in this encounter.     Procedures: No procedures performed   Clinical Data: No additional findings.   Subjective: Chief Complaint  Patient presents with   Lower Back - Follow-up, Pain    MRI review    HPI 62 year old male with several years history of progressive back pain and progressive neurogenic claudication symptoms returns post MRI scan.  Pain radiates from his back and his buttocks he has to stop and sit.  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  He gets relief with sitting 10 to 15 minutes.  It is gradually gotten worse and he is having trouble making it through the day.  Patient does have some prediabetes history of gallstones elevated cholesterol  but overall has been in good health.  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  Patient is concerned about being able to complete his job.  Occasions had to take medication to help him sleep but in the day states he is worn out and has great difficulty standing.  Denies numbness or tingling down to his legs but he states if he does not lean onto something or sit down at times after 200 feet a straight ambulation without leaning on anything he is concerned that he might fall.  Patient been followed since February.  Patient has been through anti-inflammatories and 6 weeks of physical therapy without relief.  Patient states he is having trouble standing when he goes someplace with his family to stop and sit down.  No fever chills no bowel bladder symptoms.  Patient states he is also had some pain in his neck discomfort with moving his neck but no known numbness or tingling in his arms.  Plain C-spine radiographs AP and lateral obtained today.  Review of Systems all systems noncontributory to HPI.  Positive for prediabetes.  Negative for stroke MI GI problems.  Positive spinal stenosis.   Objective: Vital Signs: There were no vitals taken for this visit.  Physical Exam Constitutional:  Appearance: He is well-developed.  HENT:     Head: Normocephalic and atraumatic.     Right Ear: External ear normal.     Left Ear: External ear normal.  Eyes:     Pupils: Pupils are equal, round, and reactive to light.  Neck:     Thyroid: No thyromegaly.     Trachea: No tracheal deviation.  Cardiovascular:     Rate and Rhythm: Normal rate.  Pulmonary:     Effort: Pulmonary effort is normal.     Breath sounds: No wheezing.  Abdominal:     General: Bowel sounds are normal.     Palpations: Abdomen is soft.  Musculoskeletal:     Cervical back: Neck supple.  Skin:    General: Skin is warm and dry.     Capillary Refill: Capillary refill  takes less than 2 seconds.  Neurological:     Mental Status: He is alert and oriented to person, place, and time.  Psychiatric:        Behavior: Behavior normal.        Thought Content: Thought content normal.        Judgment: Judgment normal.     Ortho Exam patient intact lower extremities anterior tib gastrocsoleus is intact negative straight leg raising 90 degrees.  No atrophy of the lower extremities palpable pulses negative logroll hips.  Specialty Comments:  No specialty comments available.  Imaging: CLINICAL DATA:  Low back pain with symptoms persisting over 6 weeks of treatment.   EXAM: MRI LUMBAR SPINE WITHOUT CONTRAST   TECHNIQUE: Multiplanar, multisequence MR imaging of the lumbar spine was performed. No intravenous contrast was administered.   COMPARISON:  None Available.   FINDINGS: Segmentation:  Standard.   Alignment:  Physiologic.   Vertebrae:  No fracture, evidence of discitis, or bone lesion.   Conus medullaris and cauda equina: Conus extends to the L2 level. Conus appears normal. There is cauda equina redundancy around the levels of compressive spinal stenosis.   Paraspinal and other soft tissues: Negative for perispinal mass or inflammation   Disc levels:   T12- L1: Unremarkable.   L1-L2: Mild spondylosis.   L2-L3: Mild spondylitic spurring.  Mild facet spurring.   L3-L4: Disc narrowing and bulging. Facet spurring and ligamentum flavum thickening. Combined with short pedicles there is advanced spinal stenosis   L4-L5: Disc narrowing and circumferential bulging. Bilateral inferior foraminal protrusion. Degenerative facet spurring on both sides. Advanced spinal stenosis due to degeneration and short pedicles. Moderate right foraminal narrowing.   L5-S1:Circumferential disc bulging. Degenerative facet spurring on both sides.   IMPRESSION: 1. L3-4 and L4-5 advanced spinal stenosis due to degeneration and short pedicles. 2. Moderate  foraminal narrowing on the right at L4-5.     Electronically Signed   By: Tiburcio Pea M.D.   On: 09/22/2022 22:19   AP lateral C-spine images are obtained and reviewed.  This shows some uncovertebral changes at C4-5 C5-6.  Anterior spurring with maintenance of disc space height noted at C4-5 C5-6 C6-7.  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  Disc height are maintained and no spondylolisthesis.  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  PMFS History: Patient Active Problem List   Diagnosis Date Noted   Spinal stenosis of lumbar region 08/22/2022   Follow-up examination following surgery 10/15/2018   Calculus of gallbladder without cholecystitis without obstruction    Low serum vitamin D 02/18/2018   Prediabetes 02/18/2018   Hypothyroidism 07/14/2015   Mixed hyperlipidemia  07/14/2015   Past Medical History:  Diagnosis Date   Chicken pox    Hyperlipidemia    Hypothyroidism    Thyroid disease     Family History  Problem Relation Age of Onset   Arthritis Mother    Cancer Father        bone   Cancer Maternal Grandfather    Asthma Paternal Grandmother    Diabetes Paternal Grandfather     Past Surgical History:  Procedure Laterality Date   CHOLECYSTECTOMY N/A 10/07/2018   Procedure: LAPAROSCOPIC CHOLECYSTECTOMY;  Surgeon: Franky Macho, MD;  Location: [facility];  Service: General;  Laterality: N/A;   never     Social History   Occupational History   Not on file  Tobacco Use   Smoking status: Former    Packs/day: 1.00    Years: 15.00    Additional pack years: 0.00    Total pack years: 15.00    Types: Cigarettes    Quit date: 05/20/1997    Years since quitting: 25.3   Smokeless tobacco: Never  Vaping Use   Vaping Use: Never used  Substance and Sexual Activity   Alcohol use: No    Alcohol/week: 0.0 standard drinks of alcohol   Drug use: No   Sexual activity: Yes

## 2022-10-18 ENCOUNTER — Other Ambulatory Visit: Payer: Self-pay | Admitting: Physician Assistant

## 2022-11-07 NOTE — Progress Notes (Signed)
Surgical Instructions    Your procedure is scheduled on November 18, 2022.  Report to [facility] Main Entrance "A" at 5:30 A.M., then check in with the Admitting office.  Call this number if you have problems the morning of surgery:  [phone]   If you have any questions prior to your surgery date call [phone]: Open Monday-Friday 8am-4pm If you experience any cold or flu symptoms such as cough, fever, chills, shortness of breath, etc. between now and your scheduled surgery, please notify us at the above number     Remember:  Do not eat after midnight the night before your surgery  You may drink clear liquids until 4:30AM the morning of your surgery.   Clear liquids allowed are: Water, Non-Citrus Juices (without pulp), Carbonated Beverages, Clear Tea, Black Coffee ONLY (NO MILK, CREAM OR POWDERED CREAMER of any kind), and Gatorade  Patient Instructions  The night before surgery:  No food after midnight. ONLY clear liquids after midnight  The day of surgery (if you do NOT have diabetes):  Drink ONE (1) Pre-Surgery Clear Ensure by 4:30AM the morning of surgery. Drink in one sitting. Do not sip.  This drink was given to you during your hospital  pre-op appointment visit.  Nothing else to drink after completing the  Pre-Surgery Clear Ensure.           If you have questions, please contact your surgeon's office.     Take these medicines the morning of surgery with A SIP OF WATER:    As of today, STOP taking any Aspirin (unless otherwise instructed by your surgeon) Aleve, Naproxen, Ibuprofen, Motrin, Advil, Goody's, BC's, all herbal medications, fish oil, and all vitamins.            [facility] is not responsible for any belongings or valuables.    Do NOT Smoke (Tobacco/Vaping)  24 hours prior to your procedure  If you use a CPAP at night, you may bring your mask for your overnight stay.   Contacts, glasses, hearing aids, dentures or partials may not be worn into  surgery, please bring cases for these belongings   For patients admitted to the hospital, discharge time will be determined by your treatment team.   Patients discharged the day of surgery will not be allowed to drive home, and someone needs to stay with them for 24 hours.   SURGICAL WAITING ROOM VISITATION Patients having surgery or a procedure may have no more than 2 support people in the waiting area - these visitors may rotate.   Children under the age of 73 must have an adult with them who is not the patient. If the patient needs to stay at the hospital during part of their recovery, the visitor guidelines for inpatient rooms apply. Pre-op nurse will coordinate an appropriate time for 1 support person to accompany patient in pre-op.  This support person may not rotate.   Please refer to [email or web address] for the visitor guidelines for Inpatients (after your surgery is over and you are in a regular room).    Special instructions:    Oral Hygiene is also important to reduce your risk of infection.  Remember - BRUSH YOUR TEETH THE MORNING OF SURGERY WITH YOUR REGULAR TOOTHPASTE    Pre-operative 5 CHG Bath Instructions   You can play a key role in reducing the risk of infection after surgery. Your skin needs to be as free of germs as possible. You can reduce the number of germs on your  skin by washing with CHG (chlorhexidine gluconate) soap before surgery. CHG is an antiseptic soap that kills germs and continues to kill germs even after washing.   DO NOT use if you have an allergy to chlorhexidine/CHG or antibacterial soaps. If your skin becomes reddened or irritated, stop using the CHG and notify one of our RNs at [phone].   Please shower with the CHG soap starting 4 days before surgery using the following schedule:     Please keep in mind the following:  DO NOT shave, including legs and underarms, starting the day of your  first shower.   You may shave your face at any point before/day of surgery.  Place clean sheets on your bed the day you start using CHG soap. Use a clean washcloth (not used since being washed) for each shower. DO NOT sleep with pets once you start using the CHG.   CHG Shower Instructions:  If you choose to wash your hair and private area, wash first with your normal shampoo/soap.  After you use shampoo/soap, rinse your hair and body thoroughly to remove shampoo/soap residue.  Turn the water OFF and apply about 3 tablespoons (45 ml) of CHG soap to a CLEAN washcloth.  Apply CHG soap ONLY FROM YOUR NECK DOWN TO YOUR TOES (washing for 3-5 minutes)  DO NOT use CHG soap on face, private areas, open wounds, or sores.  Pay special attention to the area where your surgery is being performed.  If you are having back surgery, having someone wash your back for you may be helpful. Wait 2 minutes after CHG soap is applied, then you may rinse off the CHG soap.  Pat dry with a clean towel  Put on clean clothes/pajamas   If you choose to wear lotion, please use ONLY the CHG-compatible lotions on the back of this paper.     Additional instructions for the day of surgery: DO NOT APPLY any lotions, deodorants, cologne, or perfumes.   Put on clean/comfortable clothes.  Brush your teeth.  Ask your nurse before applying any prescription medications to the skin. Do not wear jewelry or makeup. Do not wear lotions, powders, perfumes/cologne or deodorant. Do not shave 48 hours prior to surgery.  Men may shave face and neck. Do not bring valuables to the hospital. Do not wear nail polish, gel polish, artificial nails, or any other type of covering on natural nails (fingers and toes) If you have artificial nails or gel coating that need to be removed by a nail salon, please have this removed prior to surgery. Artificial nails or gel coating may interfere with anesthesia's ability to adequately monitor your vital  signs.     CHG Compatible Lotions   Aveeno Moisturizing lotion  Cetaphil Moisturizing Cream  Cetaphil Moisturizing Lotion  Clairol Herbal Essence Moisturizing Lotion, Dry Skin  Clairol Herbal Essence Moisturizing Lotion, Extra Dry Skin  Clairol Herbal Essence Moisturizing Lotion, Normal Skin  Curel Age Defying Therapeutic Moisturizing Lotion with Alpha Hydroxy  Curel Extreme Care Body Lotion  Curel Soothing Hands Moisturizing Hand Lotion  Curel Therapeutic Moisturizing Cream, Fragrance-Free  Curel Therapeutic Moisturizing Lotion, Fragrance-Free  Curel Therapeutic Moisturizing Lotion, Original Formula  Eucerin Daily Replenishing Lotion  Eucerin Dry Skin Therapy Plus Alpha Hydroxy Crme  Eucerin Dry Skin Therapy Plus Alpha Hydroxy Lotion  Eucerin Original Crme  Eucerin Original Lotion  Eucerin Plus Crme Eucerin Plus Lotion  Eucerin TriLipid Replenishing Lotion  Keri Anti-Bacterial Hand Lotion  Keri Deep Conditioning Original Lotion Dry Skin  Formula Softly Scented  Keri Deep Conditioning Original Lotion, Fragrance Free Sensitive Skin Formula  Keri Lotion Fast Absorbing Fragrance Free Sensitive Skin Formula  Keri Lotion Fast Absorbing Softly Scented Dry Skin Formula  Keri Original Lotion  Keri Skin Renewal Lotion Keri Silky Smooth Lotion  Keri Silky Smooth Sensitive Skin Lotion  Nivea Body Creamy Conditioning Oil  Nivea Body Extra Enriched Lotion  Nivea Body Original Lotion  Nivea Body Sheer Moisturizing Lotion Nivea Crme  Nivea Skin Firming Lotion  NutraDerm 30 Skin Lotion  NutraDerm Skin Lotion  NutraDerm Therapeutic Skin Cream  NutraDerm Therapeutic Skin Lotion  ProShield Protective Hand Cream  Provon moisturizing lotion     If you received a COVID test during your pre-op visit, it is requested that you wear a mask when out in public, stay away from anyone that may not be feeling well, and notify your surgeon if you develop symptoms. If you have been in contact with  anyone that has tested positive in the last 10 days, please notify your surgeon.    Please read over the following fact sheets that you were given.

## 2022-11-08 ENCOUNTER — Encounter (HOSPITAL_COMMUNITY): Payer: Self-pay

## 2022-11-08 ENCOUNTER — Encounter (HOSPITAL_COMMUNITY)
Admission: RE | Admit: 2022-11-08 | Discharge: 2022-11-08 | Disposition: A | Discharge status: Home / Self Care | Payer: BC Managed Care – PPO | Source: Ambulatory Visit | Attending: Orthopaedic Surgery | Admitting: Orthopaedic Surgery

## 2022-11-08 ENCOUNTER — Ambulatory Visit: Payer: BC Managed Care – PPO | Admitting: Physician Assistant

## 2022-11-08 ENCOUNTER — Encounter: Payer: Self-pay | Admitting: Physician Assistant

## 2022-11-08 ENCOUNTER — Other Ambulatory Visit: Payer: Self-pay

## 2022-11-08 VITALS — BP 168/92 | HR 79 | Ht 69.0 in | Wt 187.4 lb

## 2022-11-08 VITALS — BP 154/88 | HR 78 | Temp 98.1°F | Resp 18 | Ht 69.0 in | Wt 187.4 lb

## 2022-11-08 DIAGNOSIS — Z01818 Encounter for other preprocedural examination: Secondary | ICD-10-CM | POA: Diagnosis not present

## 2022-11-08 DIAGNOSIS — M48061 Spinal stenosis, lumbar region without neurogenic claudication: Secondary | ICD-10-CM

## 2022-11-08 LAB — CBC
HCT: 45.5 % (ref 39.0–52.0)
Hemoglobin: 14.9 g/dL (ref 13.0–17.0)
MCH: 29.8 pg (ref 26.0–34.0)
MCHC: 32.7 g/dL (ref 30.0–36.0)
MCV: 91 fL (ref 80.0–100.0)
Platelets: 332 10*3/uL (ref 150–400)
RBC: 5 MIL/uL (ref 4.22–5.81)
RDW: 12.8 % (ref 11.5–15.5)
WBC: 6.8 10*3/uL (ref 4.0–10.5)
nRBC: 0 % (ref 0.0–0.2)

## 2022-11-08 LAB — SURGICAL PCR SCREEN
MRSA, PCR: NEGATIVE
Staphylococcus aureus: NEGATIVE

## 2022-11-08 NOTE — Progress Notes (Signed)
PCP - Smitty Knudsen, FNP Cardiologist - denies  PPM/ICD - denies Device Orders - n/a Rep Notified - n/a  Chest x-ray - denies EKG - n/a Stress Test - denies ECHO - denies Cardiac Cath - denies  Sleep Study - denies CPAP - no  Fasting Blood Sugar - no DM   Last dose of GLP1 agonist-  n/a GLP1 instructions: n/a  Blood Thinner Instructions: n/a Aspirin Instructions: n/a  ERAS Protcol - yes until 0430 am PRE-SURGERY Ensure or G2- yes  COVID TEST- no    Anesthesia review: pt's BP is 154/88 this am; pt reports it's due to anxiety and discomfort/pain in his back. No hx of HTN.  Per Fayrene Fearing, [place] no EKG is needed.   Patient denies shortness of breath, fever, cough and chest pain at PAT appointment and the last 2 months.    All instructions explained to the patient, with a verbal understanding of the material. Patient agrees to go over the instructions while at home for a better understanding. Patient also instructed to self quarantine after being tested for COVID-19. The opportunity to ask questions was provided.

## 2022-11-08 NOTE — H&P (Signed)
Carl Barton is an 62 y.o. male.   Chief Complaint: Low back pain HPI:  HPI 62 year old male with several years history of progressive back pain and progressive neurogenic claudication symptoms returns post MRI scan.  Pain radiates from his back and his buttocks he has to stop and sit.  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  He gets relief with sitting 10 to 15 minutes.  It is gradually gotten worse and he is having trouble making it through the day.  Patient does have some prediabetes history of gallstones elevated cholesterol but overall has been in good health.  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  Patient is concerned about being able to complete his job.  Occasions had to take medication to help him sleep but in the day states he is worn out and has great difficulty standing.  Denies numbness or tingling down to his legs but he states if he does not lean onto something or sit down at times after 200 feet a straight ambulation without leaning on anything he is concerned that he might fall.  Patient been followed since February.  Patient has been through anti-inflammatories and 6 weeks of physical therapy without relief.  Patient states he is having trouble standing when he goes someplace with his family to stop and sit down.  No fever chills no bowel bladder symptom  Past Medical History:  Diagnosis Date   Chicken pox    Hyperlipidemia    Hypothyroidism    Thyroid disease     Past Surgical History:  Procedure Laterality Date   CHOLECYSTECTOMY N/A 10/07/2018   Procedure: LAPAROSCOPIC CHOLECYSTECTOMY;  Surgeon: Franky Macho, MD;  Location: [facility];  Service: General;  Laterality: N/A;   never      Family History  Problem Relation Age of Onset   Arthritis Mother    Cancer Father        bone   Cancer Maternal Grandfather    Asthma Paternal Grandmother    Diabetes  Paternal Grandfather    Social History:  reports that he quit smoking about 25 years ago. His smoking use included cigarettes. He has a 15.00 pack-year smoking history. He has never used smokeless tobacco. He reports that he does not drink alcohol and does not use drugs.  Allergies:  Allergies  Allergen Reactions   Atorvastatin Calcium Other (See Comments)    Other Reaction(s): Muscular Pain (2011-08-12)   Pravastatin Sodium Other (See Comments)    Other Reaction(s): Muscular Pain    (Not in a hospital admission)   No results found for this or any previous visit (from the past 48 hour(s)). No results found.  Review of Systems  All other systems reviewed and are negative.   Blood pressure (!) 168/92, pulse 79, height 5\' 9"  (1.753 m), weight 187 lb 6.4 oz (85 kg). Physical Exam  Physical Exam Constitutional:      Appearance: He is well-developed.  HENT:     Head: Normocephalic and atraumatic.     Right Ear: External ear normal.     Left Ear: External ear normal.  Eyes:     Pupils: Pupils are equal, round, and reactive to light.  Neck:     Thyroid: No thyromegaly.     Trachea: No tracheal deviation.  Cardiovascular:     Rate and Rhythm: Normal rate.  Pulmonary:     Effort:  Pulmonary effort is normal.     Breath sounds: No wheezing.  Abdominal:     General: Bowel sounds are normal.     Palpations: Abdomen is soft.  Musculoskeletal:     Cervical back: Neck supple.  Skin:    General: Skin is warm and dry.     Capillary Refill: Capillary refill takes less than 2 seconds.  Neurological:     Mental Status: He is alert and oriented to person, place, and time.  Ortho Exam patient intact lower extremities anterior tib gastrocsoleus is intact negative straight leg raising 90 degrees.  No atrophy of the lower extremities palpable pulses negative logroll hips  Assessment/Plan 2. Spinal stenosis of lumbar region with neurogenic claudication       Plan: Patient with short  pedicles and combination of congenital and acquired severe stenosis 2 levels L3-4 and L4-5.  Plan would be two-level decompression at L3-4 and L4-5 with overnight stay in the hospital.  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  Risk surgery discussed including low risk of infection, bleeding, reoperation, potential for instability possible need for fusion which is low.  Potential for dural tear which might require dural repair.  Questions elicited and answered he understands and requests we proceed.  [place] Markiya Keefe, PA 11/08/2022, 10:29 AM

## 2022-11-08 NOTE — H&P (View-Only) (Signed)
Carl Barton is an 62 y.o. male.   Chief Complaint: Low back pain HPI:  HPI 62-year-old male with several years history of progressive back pain and progressive neurogenic claudication symptoms returns post MRI scan.  Pain radiates from his back and his buttocks he has to stop and sit.  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  He gets relief with sitting 10 to 15 minutes.  It is gradually gotten worse and he is having trouble making it through the day.  Patient does have some prediabetes history of gallstones elevated cholesterol but overall has been in good health.  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  Patient is concerned about being able to complete his job.  Occasions had to take medication to help him sleep but in the day states he is worn out and has great difficulty standing.  Denies numbness or tingling down to his legs but he states if he does not lean onto something or sit down at times after 200 feet a straight ambulation without leaning on anything he is concerned that he might fall.  Patient been followed since February.  Patient has been through anti-inflammatories and 6 weeks of physical therapy without relief.  Patient states he is having trouble standing when he goes someplace with his family to stop and sit down.  No fever chills no bowel bladder symptom  Past Medical History:  Diagnosis Date   Chicken pox    Hyperlipidemia    Hypothyroidism    Thyroid disease     Past Surgical History:  Procedure Laterality Date   CHOLECYSTECTOMY N/A 10/07/2018   Procedure: LAPAROSCOPIC CHOLECYSTECTOMY;  Surgeon: Jenkins, Mark, MD;  Location: [facility];  Service: General;  Laterality: N/A;   never      Family History  Problem Relation Age of Onset   Arthritis Mother    Cancer Father        bone   Cancer Maternal Grandfather    Asthma Paternal Grandmother    Diabetes  Paternal Grandfather    Social History:  reports that he quit smoking about 25 years ago. His smoking use included cigarettes. He has a 15.00 pack-year smoking history. He has never used smokeless tobacco. He reports that he does not drink alcohol and does not use drugs.  Allergies:  Allergies  Allergen Reactions   Atorvastatin Calcium Other (See Comments)    Other Reaction(s): Muscular Pain (2011-08-12)   Pravastatin Sodium Other (See Comments)    Other Reaction(s): Muscular Pain    (Not in a hospital admission)   No results found for this or any previous visit (from the past 48 hour(s)). No results found.  Review of Systems  All other systems reviewed and are negative.   Blood pressure (!) 168/92, pulse 79, height 5' 9" (1.753 m), weight 187 lb 6.4 oz (85 kg). Physical Exam  Physical Exam Constitutional:      Appearance: He is well-developed.  HENT:     Head: Normocephalic and atraumatic.     Right Ear: External ear normal.     Left Ear: External ear normal.  Eyes:     Pupils: Pupils are equal, round, and reactive to light.  Neck:     Thyroid: No thyromegaly.     Trachea: No tracheal deviation.  Cardiovascular:     Rate and Rhythm: Normal rate.  Pulmonary:     Effort:   Pulmonary effort is normal.     Breath sounds: No wheezing.  Abdominal:     General: Bowel sounds are normal.     Palpations: Abdomen is soft.  Musculoskeletal:     Cervical back: Neck supple.  Skin:    General: Skin is warm and dry.     Capillary Refill: Capillary refill takes less than 2 seconds.  Neurological:     Mental Status: He is alert and oriented to person, place, and time.  Ortho Exam patient intact lower extremities anterior tib gastrocsoleus is intact negative straight leg raising 90 degrees.  No atrophy of the lower extremities palpable pulses negative logroll hips  Assessment/Plan 2. Spinal stenosis of lumbar region with neurogenic claudication       Plan: Patient with short  pedicles and combination of congenital and acquired severe stenosis 2 levels L3-4 and L4-5.  Plan would be two-level decompression at L3-4 and L4-5 with overnight stay in the hospital.  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  Risk surgery discussed including low risk of infection, bleeding, reoperation, potential for instability possible need for fusion which is low.  Potential for dural tear which might require dural repair.  Questions elicited and answered he understands and requests we proceed.  Gem Conkle Anne Appollonia Klee, PA 11/08/2022, 10:29 AM    

## 2022-11-08 NOTE — Progress Notes (Signed)
Office Visit Note   Patient: Carl Barton           Date of Birth: 07/24/1960           MRN: [id number] Visit Date: 11/08/2022              Requested by: Sonny Masters, FNP [address] [place],  [place] [address] PCP: Sonny Masters, FNP  Chief Complaint  Patient presents with   Lower Back - Follow-up   Pre-op Exam      HPI:  Mr. Carl Barton is a pleasant 62 year old gentleman with a history of congenital stenosis of his lumbar spine.  This is gotten worse over the years.  He has been followed by Dr. Ophelia Charter.  After an extensive discussion of surgical outcomes expectations and risks patient is going to be going forward with an lumbar L3-4 L4-5 decompression.  He is very healthy.  He has had surgery in the past without any anesthesia complications.  No recent illness.  No history of blood clots Assessment & Plan: Visit Diagnoses: Lumbar stenosis  Plan: Risks of surgery were discussed in detail as well as expectations and recovery.  Full H&P dictated into the hospital system  Follow-Up Instructions: Return 1 week after surgery.   Ortho Exam  Patient is alert, oriented, no adenopathy, well-dressed, normal affect, normal respiratory effort.       Office Visit Note              Patient: Carl Barton                                             Date of Birth: [birth year]                                                    MRN: [id number] Visit Date: 09/26/2022                                                                     Requested by: Sonny Masters, FNP [address] [place],  [place] [address] PCP: Sonny Masters, FNP     Assessment & Plan: Visit Diagnoses:  1. Neck pain   2. Spinal stenosis of lumbar region with neurogenic claudication       Plan: Patient with short pedicles and combination of congenital and acquired severe stenosis 2 levels L3-4 and L4-5.  Plan would be two-level decompression at L3-4 and L4-5 with overnight stay in the hospital.  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  Risk surgery discussed including low risk of infection, bleeding, reoperation, potential for instability possible need for fusion which is low.  Potential for dural tear which might require dural repair.  Questions elicited and answered he understands and requests we proceed.   Follow-Up Instructions: No follow-ups on file.  Orders:     Orders Placed This Encounter  Procedures   XR Cervical Spine 2 or 3 views    No orders of the defined types were placed in this encounter.        Procedures: No procedures performed     Clinical Data: No additional findings.     Subjective:     Chief Complaint  Patient presents with   Lower Back - Follow-up, Pain      MRI review      HPI 62 year old male with several years history of progressive back pain and progressive neurogenic claudication symptoms returns post MRI scan.  Pain radiates from his back and his buttocks he has to stop and sit.  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  He gets relief with sitting 10 to 15 minutes.  It is gradually gotten worse and he is having trouble making it through the day.  Patient does have some prediabetes history of gallstones elevated cholesterol but overall has been in good health.  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  Patient is concerned about being able to complete his job.  Occasions had to take medication to help him sleep but in the day states he is worn out and has great difficulty standing.  Denies numbness or tingling down to his legs but he states if he does not lean onto something or sit down at times after 200 feet a straight ambulation without leaning on anything he is concerned that he might fall.  Patient been followed since  February.  Patient has been through anti-inflammatories and 6 weeks of physical therapy without relief.  Patient states he is having trouble standing when he goes someplace with his family to stop and sit down.  No fever chills no bowel bladder symptoms.   Patient states he is also had some pain in his neck discomfort with moving his neck but no known numbness or tingling in his arms.  Plain C-spine radiographs AP and lateral obtained today.   Review of Systems all systems noncontributory to HPI.  Positive for prediabetes.  Negative for stroke MI GI problems.  Positive spinal stenosis.     Objective: Vital Signs: There were no vitals taken for this visit.   Physical Exam Constitutional:      Appearance: He is well-developed.  HENT:     Head: Normocephalic and atraumatic.     Right Ear: External ear normal.     Left Ear: External ear normal.  Eyes:     Pupils: Pupils are equal, round, and reactive to light.  Neck:     Thyroid: No thyromegaly.     Trachea: No tracheal deviation.  Cardiovascular:     Rate and Rhythm: Normal rate.  Pulmonary:     Effort: Pulmonary effort is normal.     Breath sounds: No wheezing.  Abdominal:     General: Bowel sounds are normal.     Palpations: Abdomen is soft.  Musculoskeletal:     Cervical back: Neck supple.  Skin:    General: Skin is warm and dry.     Capillary Refill: Capillary refill takes less than 2 seconds.  Neurological:     Mental Status: He is alert and oriented to person, place, and time.  Psychiatric:        Behavior: Behavior normal.        Thought Content: Thought  content normal.        Judgment: Judgment normal.        Ortho Exam patient intact lower extremities anterior tib gastrocsoleus is intact negative straight leg raising 90 degrees.  No atrophy of the lower extremities palpable pulses negative logroll hips.      Imaging: No results found. No images are attached to the encounter.  Labs: No results found for:  "HGBA1C", "ESRSEDRATE", "CRP", "LABURIC", "REPTSTATUS", "GRAMSTAIN", "CULT", "LABORGA"   Lab Results  Component Value Date   ALBUMIN 4.1 10/02/2018    No results found for: "MG" No results found for: "VD25OH"  No results found for: "PREALBUMIN"    Latest Ref Rng & Units 01/09/2021    2:40 PM 10/02/2018    9:07 AM 09/08/2018   10:22 AM  CBC EXTENDED  WBC 3.4 - 10.8 x10E3/uL 7.6  6.9  10.5   RBC 4.14 - 5.80 x10E6/uL 4.94  4.90  5.31   Hemoglobin 13.0 - 17.7 g/dL 16.1  09.6  04.5   HCT 37.5 - 51.0 % 42.9  44.0  45.1   Platelets 150 - 450 x10E3/uL 329  323  356   NEUT# 1.4 - 7.0 x10E3/uL 3.7  3.1  6.7   Lymph# 0.7 - 3.1 x10E3/uL 3.2  3.1  3.1      Body mass index is 27.67 kg/m.  Orders:  No orders of the defined types were placed in this encounter.  No orders of the defined types were placed in this encounter.    Procedures: No procedures performed  Clinical Data: No additional findings.  ROS:  All other systems negative, except as noted in the HPI. Review of Systems  Objective: Vital Signs: BP (!) 168/92   Pulse 79   Ht 5\' 9"  (1.753 m)   Wt 187 lb 6.4 oz (85 kg)   BMI 27.67 kg/m   Specialty Comments:  No specialty comments available.  PMFS History: Patient Active Problem List   Diagnosis Date Noted   Spinal stenosis of lumbar region 08/22/2022   Follow-up examination following surgery 10/15/2018   Calculus of gallbladder without cholecystitis without obstruction    Low serum vitamin D 02/18/2018   Prediabetes 02/18/2018   Hypothyroidism 07/14/2015   Mixed hyperlipidemia 07/14/2015   Past Medical History:  Diagnosis Date   Chicken pox    Hyperlipidemia    Hypothyroidism    Thyroid disease     Family History  Problem Relation Age of Onset   Arthritis Mother    Cancer Father        bone   Cancer Maternal Grandfather    Asthma Paternal Grandmother    Diabetes Paternal Grandfather     Past Surgical History:  Procedure Laterality Date    CHOLECYSTECTOMY N/A 10/07/2018   Procedure: LAPAROSCOPIC CHOLECYSTECTOMY;  Surgeon: Franky Macho, MD;  Location: [facility];  Service: General;  Laterality: N/A;   never     Social History   Occupational History   Not on file  Tobacco Use   Smoking status: Former    Packs/day: 1.00    Years: 15.00    Additional pack years: 0.00    Total pack years: 15.00    Types: Cigarettes    Quit date: 05/20/1997    Years since quitting: 25.4   Smokeless tobacco: Never  Vaping Use   Vaping Use: Never used  Substance and Sexual Activity   Alcohol use: No    Alcohol/week: 0.0 standard drinks of alcohol   Drug use: No  Sexual activity: Yes

## 2022-11-17 ENCOUNTER — Encounter (HOSPITAL_COMMUNITY): Payer: Self-pay | Admitting: Orthopaedic Surgery

## 2022-11-17 NOTE — Anesthesia Preprocedure Evaluation (Signed)
Anesthesia Evaluation  Patient identified by MRN, date of birth, ID band Patient awake    Reviewed: NPO status , Patient's Chart, lab work & pertinent test results  Airway Mallampati: III  TM Distance: >3 FB     Dental no notable dental hx. (+) Teeth Intact, Caps, Dental Advisory Given   Pulmonary former smoker Snores    Pulmonary exam normal breath sounds clear to auscultation       Cardiovascular negative cardio ROS Normal cardiovascular exam Rhythm:Regular Rate:Normal     Neuro/Psych negative neurological ROS  negative psych ROS   GI/Hepatic negative GI ROS, Neg liver ROS,,,  Endo/Other  Hypothyroidism  HLD  Renal/GU negative Renal ROS  negative genitourinary   Musculoskeletal Lumbar spinal stenosis L3-4 and L4-5   Abdominal   Peds  Hematology negative hematology ROS (+)   Anesthesia Other Findings   Reproductive/Obstetrics                             Anesthesia Physical Anesthesia Plan  ASA: 2  Anesthesia Plan: General   Post-op Pain Management: Dilaudid IV, Precedex and Ofirmev IV (intra-op)*   Induction: Intravenous  PONV Risk Score and Plan: 3 and Treatment may vary due to age or medical condition, Ondansetron and Dexamethasone  Airway Management Planned: Oral ETT  Additional Equipment: None  Intra-op Plan:   Post-operative Plan: Extubation in OR  Informed Consent: I have reviewed the patients History and Physical, chart, labs and discussed the procedure including the risks, benefits and alternatives for the proposed anesthesia with the patient or authorized representative who has indicated his/her understanding and acceptance.     Dental advisory given  Plan Discussed with: Anesthesiologist and CRNA  Anesthesia Plan Comments:        Anesthesia Quick Evaluation

## 2022-11-18 ENCOUNTER — Observation Stay (HOSPITAL_COMMUNITY)
Admission: RE | Admit: 2022-11-18 | Discharge: 2022-11-19 | Disposition: A | Discharge status: Home / Self Care | Payer: BC Managed Care – PPO | Source: Ambulatory Visit | Attending: Orthopaedic Surgery | Admitting: Orthopaedic Surgery

## 2022-11-18 ENCOUNTER — Ambulatory Visit (HOSPITAL_COMMUNITY): Payer: BC Managed Care – PPO

## 2022-11-18 ENCOUNTER — Ambulatory Visit (HOSPITAL_COMMUNITY): Payer: BC Managed Care – PPO | Admitting: Anesthesiology

## 2022-11-18 ENCOUNTER — Encounter (HOSPITAL_COMMUNITY): Payer: Self-pay | Admitting: Orthopaedic Surgery

## 2022-11-18 ENCOUNTER — Ambulatory Visit (HOSPITAL_COMMUNITY): Payer: BC Managed Care – PPO | Admitting: Physician Assistant

## 2022-11-18 ENCOUNTER — Other Ambulatory Visit: Payer: Self-pay

## 2022-11-18 ENCOUNTER — Encounter (HOSPITAL_COMMUNITY)
Admission: RE | Disposition: A | Discharge status: Home / Self Care | Payer: Self-pay | Source: Ambulatory Visit | Attending: Orthopaedic Surgery

## 2022-11-18 DIAGNOSIS — E039 Hypothyroidism, unspecified: Secondary | ICD-10-CM | POA: Diagnosis not present

## 2022-11-18 DIAGNOSIS — Z87891 Personal history of nicotine dependence: Secondary | ICD-10-CM | POA: Insufficient documentation

## 2022-11-18 DIAGNOSIS — Z9889 Other specified postprocedural states: Secondary | ICD-10-CM

## 2022-11-18 DIAGNOSIS — M48062 Spinal stenosis, lumbar region with neurogenic claudication: Principal | ICD-10-CM | POA: Insufficient documentation

## 2022-11-18 DIAGNOSIS — Z0189 Encounter for other specified special examinations: Secondary | ICD-10-CM | POA: Diagnosis not present

## 2022-11-18 HISTORY — PX: LUMBAR LAMINECTOMY/DECOMPRESSION MICRODISCECTOMY: SHX5026

## 2022-11-18 SURGERY — LUMBAR LAMINECTOMY/DECOMPRESSION MICRODISCECTOMY
Anesthesia: General | Site: Spine Lumbar

## 2022-11-18 MED ORDER — CHLORHEXIDINE GLUCONATE 0.12 % MT SOLN
15.0000 mL | Freq: Once | OROMUCOSAL | Status: AC
  Administered 2022-11-18: 15 mL via OROMUCOSAL

## 2022-11-18 MED ORDER — ONDANSETRON HCL 4 MG/2ML IJ SOLN: 4.0000 mg | Freq: Once | INTRAMUSCULAR | Status: DC | PRN

## 2022-11-18 MED ORDER — 0.9 % SODIUM CHLORIDE (POUR BTL) OPTIME
TOPICAL | Status: DC | PRN
  Administered 2022-11-18: 1000 mL

## 2022-11-18 MED ORDER — SODIUM CHLORIDE 0.9 % IV SOLN
250.0000 mL | INTRAVENOUS | Status: DC
  Administered 2022-11-18: 250 mL via INTRAVENOUS

## 2022-11-18 MED ORDER — SUGAMMADEX SODIUM 200 MG/2ML IV SOLN
INTRAVENOUS | Status: DC | PRN
  Administered 2022-11-18: 200 mg via INTRAVENOUS

## 2022-11-18 MED ORDER — MULTI-VITAMIN/MINERALS PO TABS: 1.0000 | ORAL_TABLET | Freq: Every day | ORAL | Status: DC

## 2022-11-18 MED ORDER — FENTANYL CITRATE (PF) 250 MCG/5ML IJ SOLN
INTRAMUSCULAR | Status: AC
  Filled 2022-11-18: qty 5

## 2022-11-18 MED ORDER — DROPERIDOL 2.5 MG/ML IJ SOLN: 0.6250 mg | Freq: Once | INTRAMUSCULAR | Status: DC | PRN

## 2022-11-18 MED ORDER — MIDAZOLAM HCL 2 MG/2ML IJ SOLN
INTRAMUSCULAR | Status: AC
  Filled 2022-11-18: qty 2

## 2022-11-18 MED ORDER — PHENOL 1.4 % MT LIQD: 1.0000 | OROMUCOSAL | Status: DC | PRN

## 2022-11-18 MED ORDER — POTASSIUM CHLORIDE IN NACL 20-0.45 MEQ/L-% IV SOLN
INTRAVENOUS | Status: DC
  Filled 2022-11-18: qty 1000

## 2022-11-18 MED ORDER — ONDANSETRON HCL 4 MG/2ML IJ SOLN
INTRAMUSCULAR | Status: DC | PRN
  Administered 2022-11-18: 4 mg via INTRAVENOUS

## 2022-11-18 MED ORDER — ACETAMINOPHEN 325 MG PO TABS: 650.0000 mg | ORAL_TABLET | ORAL | Status: DC | PRN

## 2022-11-18 MED ORDER — OXYCODONE HCL 5 MG/5ML PO SOLN: 5.0000 mg | Freq: Once | ORAL | Status: DC | PRN

## 2022-11-18 MED ORDER — PROPOFOL 10 MG/ML IV BOLUS
INTRAVENOUS | Status: AC
  Filled 2022-11-18: qty 20

## 2022-11-18 MED ORDER — ONDANSETRON HCL 4 MG/2ML IJ SOLN: 4.0000 mg | Freq: Four times a day (QID) | INTRAMUSCULAR | Status: DC | PRN

## 2022-11-18 MED ORDER — HYDROCODONE-ACETAMINOPHEN 10-325 MG PO TABS: 1.0000 | ORAL_TABLET | ORAL | Status: DC | PRN

## 2022-11-18 MED ORDER — SODIUM CHLORIDE 0.9% FLUSH: 3.0000 mL | INTRAVENOUS | Status: DC | PRN

## 2022-11-18 MED ORDER — HYDROMORPHONE HCL 1 MG/ML IJ SOLN
INTRAMUSCULAR | Status: AC
  Filled 2022-11-18: qty 1

## 2022-11-18 MED ORDER — HYDROCODONE-ACETAMINOPHEN 7.5-325 MG PO TABS
1.0000 | ORAL_TABLET | Freq: Four times a day (QID) | ORAL | Status: DC
  Administered 2022-11-18 – 2022-11-19 (×4): 1 via ORAL
  Filled 2022-11-18 (×4): qty 1

## 2022-11-18 MED ORDER — PHENYLEPHRINE 80 MCG/ML (10ML) SYRINGE FOR IV PUSH (FOR BLOOD PRESSURE SUPPORT)
PREFILLED_SYRINGE | INTRAVENOUS | Status: DC | PRN
  Administered 2022-11-18 (×3): 160 ug via INTRAVENOUS
  Administered 2022-11-18: 40 ug via INTRAVENOUS

## 2022-11-18 MED ORDER — SODIUM CHLORIDE 0.45 % IV SOLN: INTRAVENOUS | Status: DC

## 2022-11-18 MED ORDER — THROMBIN 20000 UNITS EX SOLR
OROMUCOSAL | Status: DC | PRN
  Administered 2022-11-18: 20 mL via TOPICAL

## 2022-11-18 MED ORDER — MENTHOL 3 MG MT LOZG: 1.0000 | LOZENGE | OROMUCOSAL | Status: DC | PRN

## 2022-11-18 MED ORDER — OXYCODONE HCL 5 MG PO TABS: 5.0000 mg | ORAL_TABLET | Freq: Once | ORAL | Status: DC | PRN

## 2022-11-18 MED ORDER — SODIUM CHLORIDE 0.9% FLUSH
3.0000 mL | Freq: Two times a day (BID) | INTRAVENOUS | Status: DC
  Administered 2022-11-18 (×2): 3 mL via INTRAVENOUS

## 2022-11-18 MED ORDER — METHOCARBAMOL 500 MG PO TABS
500.0000 mg | ORAL_TABLET | Freq: Four times a day (QID) | ORAL | Status: DC | PRN
  Administered 2022-11-18 – 2022-11-19 (×3): 500 mg via ORAL
  Filled 2022-11-18 (×3): qty 1

## 2022-11-18 MED ORDER — ORAL CARE MOUTH RINSE: 15.0000 mL | Freq: Once | OROMUCOSAL | Status: AC

## 2022-11-18 MED ORDER — LEVOTHYROXINE SODIUM 100 MCG PO TABS
100.0000 ug | ORAL_TABLET | Freq: Every day | ORAL | Status: DC
  Administered 2022-11-19: 100 ug via ORAL
  Filled 2022-11-18: qty 1

## 2022-11-18 MED ORDER — PROPOFOL 10 MG/ML IV BOLUS
INTRAVENOUS | Status: DC | PRN
  Administered 2022-11-18: 170 mg via INTRAVENOUS

## 2022-11-18 MED ORDER — LIDOCAINE 2% (20 MG/ML) 5 ML SYRINGE
INTRAMUSCULAR | Status: DC | PRN
  Administered 2022-11-18: 100 mg via INTRAVENOUS

## 2022-11-18 MED ORDER — HYDROMORPHONE HCL 1 MG/ML IJ SOLN
0.2500 mg | INTRAMUSCULAR | Status: DC | PRN
  Administered 2022-11-18: 0.5 mg via INTRAVENOUS

## 2022-11-18 MED ORDER — HYDROMORPHONE HCL 1 MG/ML IJ SOLN: 1.0000 mg | INTRAMUSCULAR | Status: DC | PRN

## 2022-11-18 MED ORDER — THROMBIN 20000 UNITS EX SOLR
CUTANEOUS | Status: AC
  Filled 2022-11-18: qty 20000

## 2022-11-18 MED ORDER — METHOCARBAMOL 1000 MG/10ML IJ SOLN: 500.0000 mg | Freq: Four times a day (QID) | INTRAVENOUS | Status: DC | PRN

## 2022-11-18 MED ORDER — BUPIVACAINE HCL (PF) 0.25 % IJ SOLN
INTRAMUSCULAR | Status: DC | PRN
  Administered 2022-11-18: 10 mL

## 2022-11-18 MED ORDER — FENTANYL CITRATE (PF) 250 MCG/5ML IJ SOLN
INTRAMUSCULAR | Status: DC | PRN
  Administered 2022-11-18: 100 ug via INTRAVENOUS

## 2022-11-18 MED ORDER — DEXAMETHASONE SODIUM PHOSPHATE 4 MG/ML IJ SOLN
INTRAMUSCULAR | Status: DC | PRN
  Administered 2022-11-18: 6 mg via INTRAVENOUS

## 2022-11-18 MED ORDER — ONDANSETRON HCL 4 MG PO TABS: 4.0000 mg | ORAL_TABLET | Freq: Four times a day (QID) | ORAL | Status: DC | PRN

## 2022-11-18 MED ORDER — ACETAMINOPHEN 650 MG RE SUPP: 650.0000 mg | RECTAL | Status: DC | PRN

## 2022-11-18 MED ORDER — GABAPENTIN 300 MG PO CAPS
300.0000 mg | ORAL_CAPSULE | Freq: Three times a day (TID) | ORAL | Status: DC
  Administered 2022-11-18 (×3): 300 mg via ORAL
  Filled 2022-11-18 (×3): qty 1

## 2022-11-18 MED ORDER — MIDAZOLAM HCL 5 MG/5ML IJ SOLN
INTRAMUSCULAR | Status: DC | PRN
  Administered 2022-11-18: 2 mg via INTRAVENOUS

## 2022-11-18 MED ORDER — CEFAZOLIN SODIUM-DEXTROSE 2-4 GM/100ML-% IV SOLN
2.0000 g | INTRAVENOUS | Status: AC
  Administered 2022-11-18: 2 g via INTRAVENOUS

## 2022-11-18 MED ORDER — LACTATED RINGERS IV SOLN: INTRAVENOUS | Status: DC

## 2022-11-18 MED ORDER — BUPIVACAINE HCL (PF) 0.25 % IJ SOLN
INTRAMUSCULAR | Status: AC
  Filled 2022-11-18: qty 30

## 2022-11-18 MED ORDER — PHENYLEPHRINE HCL-NACL 20-0.9 MG/250ML-% IV SOLN
INTRAVENOUS | Status: DC | PRN
  Administered 2022-11-18: 40 ug/min via INTRAVENOUS

## 2022-11-18 MED ORDER — KETOROLAC TROMETHAMINE 15 MG/ML IJ SOLN
15.0000 mg | Freq: Four times a day (QID) | INTRAMUSCULAR | Status: AC
  Administered 2022-11-18 – 2022-11-19 (×4): 15 mg via INTRAVENOUS
  Filled 2022-11-18 (×4): qty 1

## 2022-11-18 MED ORDER — ROCURONIUM BROMIDE 10 MG/ML (PF) SYRINGE
PREFILLED_SYRINGE | INTRAVENOUS | Status: DC | PRN
  Administered 2022-11-18: 60 mg via INTRAVENOUS

## 2022-11-18 SURGICAL SUPPLY — 47 items
ADH SKN CLS APL DERMABOND .7 (GAUZE/BANDAGES/DRESSINGS) ×1
BAG COUNTER SPONGE SURGICOUNT (BAG) ×1 IMPLANT
BAG SPNG CNTER NS LX DISP (BAG) ×1
BUR ROUND FLUTED 4 SOFT TCH (BURR) IMPLANT
CANISTER SUCT 3000ML PPV (MISCELLANEOUS) ×1 IMPLANT
CLSR STERI-STRIP ANTIMIC 1/2X4 (GAUZE/BANDAGES/DRESSINGS) ×1 IMPLANT
COVER SURGICAL LIGHT HANDLE (MISCELLANEOUS) ×1 IMPLANT
DERMABOND ADVANCED .7 DNX12 (GAUZE/BANDAGES/DRESSINGS) IMPLANT
DRAPE HALF SHEET 40X57 (DRAPES) ×2 IMPLANT
DRAPE MICROSCOPE SLANT 54X150 (MISCELLANEOUS) ×1 IMPLANT
DRAPE SURG 17X23 STRL (DRAPES) ×1 IMPLANT
DRESSING MEPILEX FLEX 4X4 (GAUZE/BANDAGES/DRESSINGS) ×1 IMPLANT
DRSG MEPILEX FLEX 4X4 (GAUZE/BANDAGES/DRESSINGS) ×1
DURAPREP 26ML APPLICATOR (WOUND CARE) ×1 IMPLANT
ELECT BLADE 4.0 EZ CLEAN MEGAD (MISCELLANEOUS) ×1
ELECT REM PT RETURN 9FT ADLT (ELECTROSURGICAL) ×1
ELECTRODE BLDE 4.0 EZ CLN MEGD (MISCELLANEOUS) IMPLANT
ELECTRODE REM PT RTRN 9FT ADLT (ELECTROSURGICAL) ×1 IMPLANT
GLOVE BIOGEL PI IND STRL 8 (GLOVE) ×2 IMPLANT
GLOVE ORTHO TXT STRL SZ7.5 (GLOVE) ×2 IMPLANT
GOWN STRL REUS W/ TWL LRG LVL3 (GOWN DISPOSABLE) ×2 IMPLANT
GOWN STRL REUS W/ TWL XL LVL3 (GOWN DISPOSABLE) ×1 IMPLANT
GOWN STRL REUS W/TWL 2XL LVL3 (GOWN DISPOSABLE) ×1 IMPLANT
GOWN STRL REUS W/TWL LRG LVL3 (GOWN DISPOSABLE) ×2
GOWN STRL REUS W/TWL XL LVL3 (GOWN DISPOSABLE) ×1
KIT BASIN OR (CUSTOM PROCEDURE TRAY) ×1 IMPLANT
KIT TURNOVER KIT B (KITS) ×1 IMPLANT
MANIFOLD NEPTUNE II (INSTRUMENTS) ×1 IMPLANT
NDL HYPO 25GX1X1/2 BEV (NEEDLE) ×1 IMPLANT
NDL SPNL 18GX3.5 QUINCKE PK (NEEDLE) ×1 IMPLANT
NEEDLE HYPO 25GX1X1/2 BEV (NEEDLE) ×1 IMPLANT
NEEDLE SPNL 18GX3.5 QUINCKE PK (NEEDLE) ×1 IMPLANT
NS IRRIG 1000ML POUR BTL (IV SOLUTION) ×1 IMPLANT
PACK LAMINECTOMY ORTHO (CUSTOM PROCEDURE TRAY) ×1 IMPLANT
PAD ARMBOARD 7.5X6 YLW CONV (MISCELLANEOUS) ×2 IMPLANT
PATTIES SURGICAL .5 X.5 (GAUZE/BANDAGES/DRESSINGS) IMPLANT
PATTIES SURGICAL .75X.75 (GAUZE/BANDAGES/DRESSINGS) IMPLANT
SPIKE FLUID TRANSFER (MISCELLANEOUS) ×1 IMPLANT
SUT VIC AB 0 CT1 27 (SUTURE) ×1
SUT VIC AB 0 CT1 27XBRD ANBCTR (SUTURE) IMPLANT
SUT VIC AB 1 CTX 36 (SUTURE) ×1
SUT VIC AB 1 CTX36XBRD ANBCTR (SUTURE) ×1 IMPLANT
SUT VIC AB 2-0 CT1 27 (SUTURE) ×1
SUT VIC AB 2-0 CT1 TAPERPNT 27 (SUTURE) ×1 IMPLANT
SUT VIC AB 3-0 X1 27 (SUTURE) ×1 IMPLANT
TOWEL GREEN STERILE (TOWEL DISPOSABLE) ×1 IMPLANT
TOWEL GREEN STERILE FF (TOWEL DISPOSABLE) ×1 IMPLANT

## 2022-11-18 NOTE — Anesthesia Procedure Notes (Signed)
Procedure Name: Intubation Date/Time: 11/18/2022 7:41 AM  Performed by: Caren Macadam, CRNAPre-anesthesia Checklist: Patient identified, Emergency Drugs available, Suction available and Patient being monitored Patient Re-evaluated:Patient Re-evaluated prior to induction Oxygen Delivery Method: Circle system utilized Preoxygenation: Pre-oxygenation with 100% oxygen Induction Type: IV induction Ventilation: Mask ventilation without difficulty Laryngoscope Size: Glidescope Grade View: Grade I Tube type: Oral Tube size: 7.5 mm Number of attempts: 1 Airway Equipment and Method: Stylet Placement Confirmation: ETT inserted through vocal cords under direct vision, positive ETCO2 and breath sounds checked- equal and bilateral Secured at: 21 cm Tube secured with: Tape Dental Injury: Teeth and Oropharynx as per pre-operative assessment

## 2022-11-18 NOTE — Op Note (Addendum)
Pre and postop diagnosis: L3-4, L4-5 spinal stenosis with neurogenic claudication  Procedure: Central decompression for spinal stenosis L3, L4, L5.  Microscope assisted.  Surgeon: Annell Greening, MD  Assistant: Willia Craze, MD, Medically necessary for protection of neural strutures and present for the entire procedure.   Anesthesia: General orotracheal  EBL: 150 cc  Drains: None  Implants: None.  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  MRI scan showed severe spinal stenosis multifactorial with short pedicles at L3-4 and L4-5.  Procedure: After induction general esthesia ultra oral tracheal intubation prone positioning careful padding arms at 9090 pillows underneath knees and feet 1010 drape was applied DuraPrep.  Ancef was given 2 g prophylactically.  Area squared with towels sterile skin marker Betadine Steri-Drape laminectomy sheets and drapes and timeout procedure was completed.  Spinal needle was placed adjusted and repeat x-ray so that first needle was just above the L3-4 disc and other piece of the 18-gauge needle was just below L4-5.  Skin was marked at the needle and midline incision was made subperiosteal dissection onto the lamina out to the facets.  There is short distance between the facets as expected.  Self-retaining McCullough retractor was placed using 2 blades on each side and second x-ray was taken with Kocher clamps placed just below the L4 5-2 disc space and just above the L3-4 disc base.  Second x-ray was taken and confirmed and the bone was marked with a purple marker.  Central decompression was performed taking inferior aspect of L3.  Complete laminectomy at L4 and top portion of L5.  Dr. Willia Craze assisted and was medically necessary present for protection of vital neural structures dura gentle retraction on nerve roots during the decompression procedure using the operative  microscope, exposure as well as closure.  Thick chunks of ligament were removed and the lateral walls after using a Kerrison starting distally coming proximally at the midline all the way to the top osteotomes were used to correct the bone and then removed with a Kerrison rongeur.  Thick chunks of ligament were removed as well as overhanging portions of the facet joint with partial facetectomy to decompress the dura and round tube.  Disc base at 3 4 and 4 5 was checked on each side.  Prominent Gelfoam was used in the gutters bipolar cautery is not needed.  After complete decompression dural tube was round no areas compression good gutters were checked right and left make sure no Gelfoam with thrombin was left and there were no remaining chunks of ligament or overhanging spurs from the facet that was causing dural compression.  Repeat irrigation operative epidural space was dry.  #1 Vicryl in the fascia 2-0 Vicryl subtenons tissue 3-0 Vicryl subcuticular closure.  Dermabond Steri-Strips Mepilex dressing was applied and patient was transferred to care room.

## 2022-11-18 NOTE — Anesthesia Postprocedure Evaluation (Signed)
Anesthesia Post Note  Patient: Carl Barton  Procedure(s) Performed: L3-4, L4-5 DECOMPRESSION (Spine Lumbar)     Patient location during evaluation: PACU Anesthesia Type: General Level of consciousness: awake and alert and oriented Pain management: pain level controlled Vital Signs Assessment: post-procedure vital signs reviewed and stable Respiratory status: spontaneous breathing, nonlabored ventilation and respiratory function stable Cardiovascular status: blood pressure returned to baseline and stable Postop Assessment: no apparent nausea or vomiting Anesthetic complications: no   No notable events documented.  Last Vitals:  Vitals:   11/18/22 0945 11/18/22 1000  BP: 133/83 130/79  Pulse: 87 79  Resp: 19 12  Temp:    SpO2: 96% 97%    Last Pain:  Vitals:   11/18/22 0945  TempSrc:   PainSc: 6                  Leanna Hamid A.

## 2022-11-18 NOTE — Transfer of Care (Signed)
Immediate Anesthesia Transfer of Care Note  Patient: Carl Barton  Procedure(s) Performed: L3-4, L4-5 DECOMPRESSION (Spine Lumbar)  Patient Location: PACU  Anesthesia Type:General  Level of Consciousness: awake  Airway & Oxygen Therapy: Patient Spontanous Breathing and Patient connected to face mask oxygen  Post-op Assessment: Report given to RN and Post -op Vital signs reviewed and stable  Post vital signs: Reviewed  Last Vitals:  Vitals Value Taken Time  BP 127/83 11/18/22 0930  Temp    Pulse 87 11/18/22 0931  Resp 21 11/18/22 0931  SpO2 99 % 11/18/22 0931  Vitals shown include unvalidated device data.  Last Pain:  Vitals:   11/18/22 0619  TempSrc:   PainSc: 0-No pain         Complications: No notable events documented.

## 2022-11-18 NOTE — Interval H&P Note (Signed)
History and Physical Interval Note:  11/18/2022 7:23 AM  Carl Barton  has presented today for surgery, with the diagnosis of L3-4, L4-5 stenosis.  The various methods of treatment have been discussed with the patient and family. After consideration of risks, benefits and other options for treatment, the patient has consented to  Procedure(s): L3-4, L4-5 DECOMPRESSION (N/A) as a surgical intervention.  The patient's history has been reviewed, patient examined, no change in status, stable for surgery.  I have reviewed the patient's chart and labs.  Questions were answered to the patient's satisfaction.     Eldred Manges

## 2022-11-19 ENCOUNTER — Encounter (HOSPITAL_COMMUNITY): Payer: Self-pay | Admitting: Orthopaedic Surgery

## 2022-11-19 DIAGNOSIS — E039 Hypothyroidism, unspecified: Secondary | ICD-10-CM | POA: Diagnosis not present

## 2022-11-19 DIAGNOSIS — M48062 Spinal stenosis, lumbar region with neurogenic claudication: Secondary | ICD-10-CM | POA: Diagnosis not present

## 2022-11-19 DIAGNOSIS — Z87891 Personal history of nicotine dependence: Secondary | ICD-10-CM | POA: Diagnosis not present

## 2022-11-19 MED ORDER — OXYCODONE-ACETAMINOPHEN 5-325 MG PO TABS: 1.0000 | ORAL_TABLET | Freq: Four times a day (QID) | ORAL | 0 refills | Status: DC | PRN

## 2022-11-19 MED ORDER — METHOCARBAMOL 500 MG PO TABS: 500.0000 mg | ORAL_TABLET | Freq: Four times a day (QID) | ORAL | 0 refills | Status: DC | PRN

## 2022-11-19 NOTE — Discharge Instructions (Signed)
Walk daily , avoid bending and lifting. Gradually increase walking distance over several weeks up to 2 miles per day.

## 2022-11-19 NOTE — Evaluation (Signed)
Occupational Therapy Evaluation and Discharge Patient Details Name: Carl Barton MRN: [id number] DOB: [birth year] Today's Date: 11/19/2022   History of Present Illness 62 year old male working with neurogenic claudication requiring him to stop after 200 feet at rest or leaning over cart.  MRI scan showed severe spinal stenosis multifactorial with short pedicles at L3-4 and L4-5. S/p  7/1 Central decompression for spinal stenosis L3, L4, L5. PMH: Thyroid disease.   Clinical Impression   This 62 yo male presents to acute OT with all education completed, we will D/C from acute OT. No PT needs identified and they were made aware.      Recommendations for follow up therapy are one component of a multi-disciplinary discharge planning process, led by the attending physician.  Recommendations may be updated based on patient status, additional functional criteria and insurance authorization.   Assistance Recommended at Discharge PRN  Patient can return home with the following Assist for transportation    Functional Status Assessment  Patient has had a recent decline in their functional status and demonstrates the ability to make significant improvements in function in a reasonable and predictable amount of time. (without further OT needs and not PT needs identified. education completed and post op back handout provided)  Equipment Recommendations  None recommended by OT       Precautions / Restrictions Precautions Precautions: Back Precaution Booklet Issued: Yes (comment) Required Braces or Orthoses:  (no brace per orders) Restrictions Weight Bearing Restrictions: No      Mobility Bed Mobility Overal bed mobility: Modified Independent                  Transfers Overall transfer level: Independent                 General transfer comment: ambulated in hallways without AD no issues (independent)      Balance Overall balance assessment: Independent                                          ADL either performed or assessed with clinical judgement   ADL Overall ADL's : Modified independent                                       General ADL Comments: Educated on lower body dressing techniques, getting wet and rinsed off in walk in shower, getting in and out of bed, use of pillows in bed, building up sitting tolerance from 20-30 minutes up to an hour; sit<>stand stance that helps keep back more straight, using wet wipes for back peri care, using 2 cups for brushing teeth.     Vision Patient Visual Report: No change from baseline              Pertinent Vitals/Pain Pain Assessment Pain Assessment: No/denies pain     Hand Dominance Right   Extremity/Trunk Assessment Upper Extremity Assessment Upper Extremity Assessment: Overall WFL for tasks assessed           Communication Communication Communication: No difficulties   Cognition Arousal/Alertness: Awake/alert Behavior During Therapy: WFL for tasks assessed/performed Overall Cognitive Status: Within Functional Limits for tasks assessed  Home Living Family/patient expects to be discharged to:: Private residence Living Arrangements: Spouse/significant other Available Help at Discharge: Family;Available 24 hours/day Type of Home: House       Home Layout: One level     Bathroom Shower/Tub: Producer, television/film/video: Handicapped height     Home Equipment: None          Prior Functioning/Environment Prior Level of Function : Independent/Modified Independent;Driving                        OT Problem List: Decreased range of motion         OT Goals(Current goals can be found in the care plan section) Acute Rehab OT Goals Patient Stated Goal: home today         AM-PAC OT "6 Clicks" Daily Activity     Outcome Measure Help from another person eating  meals?: None Help from another person taking care of personal grooming?: None Help from another person toileting, which includes using toliet, bedpan, or urinal?: None Help from another person bathing (including washing, rinsing, drying)?: None Help from another person to put on and taking off regular upper body clothing?: None Help from another person to put on and taking off regular lower body clothing?: None 6 Click Score: 24   End of Session Nurse Communication:  (no further OT nor PT needs)  Activity Tolerance: Patient tolerated treatment well Patient left:  (sitting EOB)  OT Visit Diagnosis: Muscle weakness (generalized) (M62.81)                Time: 1610-9604 OT Time Calculation (min): 20 min Charges:  OT General Charges $OT Visit: 1 Visit OT Evaluation $OT Eval Moderate Complexity: 1 Mod  Cathy L. OT Acute Rehabilitation Services Office [phone]    Evette Georges 11/19/2022, 8:42 AM

## 2022-11-19 NOTE — Progress Notes (Signed)
Patient alert and oriented, mae's well, voiding adequate amount of urine, swallowing without difficulty, no c/o pain at time of discharge. Patient discharged home with family. Script and discharged instructions given to patient. Patient and family stated understanding of instructions given. Patient has an appointment with Dr. Yates  

## 2022-11-19 NOTE — Discharge Summary (Signed)
Physician Discharge Summary  Patient ID: Carl Barton MRN: [id number] DOB/AGE: 1960/08/25 62 y.o.  Admit date: 11/18/2022 Discharge date: 11/19/2022  Admission Diagnoses: Lumbar spinal stenosis with claudication  Discharge Diagnoses: Same   Discharged Condition: good  Hospital Course: Patient was admitted with lumbar stenosis severe at L3-4, L4-5 with neurogenic claudication symptoms.  He underwent surgery by Dr. Ophelia Charter with Dr. Willia Craze assisting with decompression at the L3, L4 and L5 level.  No IntraOp complications.  Postop dressing was changed due to slight blood on the dressing.  He was amatory in the halls able to void and was discharged 11/19/2022 without follow-up in 1 week.  Pain medication Percocet and Robaxin were prescribed.  Consults: Physical therapy and Occupational Therapy.  Significant Diagnostic Studies: Routine admission labs were normal.  PCR was negative.  Treatments: Lumbar decompression L3-4 L4-5  Discharge Exam: Blood pressure 135/69, pulse 77, temperature 97.7 F (36.5 C), temperature source Oral, resp. rate 20, height 5\' 9"  (1.753 m), weight 86.2 kg, SpO2 98 %. Physical exam: Patient was neurologically intact at discharge.  Dressing was dry.  He was given some extra extra dressing supplies through remove the dressing in 48 hours and then he could reapply dressing after showering.  Disposition: Discharge disposition: 01-Home or Self Care       Discharge Instructions     Incentive spirometry RT   Complete by: As directed       Allergies as of 11/19/2022       Reactions   Atorvastatin Calcium Other (See Comments)   Other Reaction(s): Muscular Pain (2011-08-12)   Pravastatin Sodium Other (See Comments)   Other Reaction(s): Muscular Pain        Medication List     TAKE these medications    levothyroxine 100 MCG tablet Commonly known as: SYNTHROID Take 100 mcg by mouth daily before breakfast.   methocarbamol 500 MG  tablet Commonly known as: ROBAXIN Take 1 tablet (500 mg total) by mouth every 6 (six) hours as needed for muscle spasms.   multivitamin with minerals tablet Take 1 tablet by mouth daily.   oxyCODONE-acetaminophen 5-325 MG tablet Commonly known as: Percocet Take 1 tablet by mouth every 6 (six) hours as needed for severe pain.        Follow-up Information     Eldred Manges, MD Follow up in 1 week(s).   Specialty: Orthopedic Surgery Contact information: [address] [place] [address] [phone]                 Signed: Eldred Manges 11/19/2022, 12:48 PM

## 2022-11-19 NOTE — Progress Notes (Signed)
PT Cancellation Note  Patient Details Name: Carl Barton MRN: [id number] DOB: 27-Feb-1961   Cancelled Treatment:    Reason Eval/Treat Not Completed: (P) OT screened, walking independently in hallway, no needs identified, will sign off   Lanora Manis B. Beverely Risen PT, DPT Acute Rehabilitation Services Please use secure chat or  Call Office [phone]  Elon Alas [facility] 11/19/2022, 9:58 AM

## 2022-11-19 NOTE — Progress Notes (Signed)
Subjective: 1 Day Post-Op Procedure(s) (LRB): L3-4, L4-5 DECOMPRESSION (N/A) Patient reports pain as mild.    Objective: Vital signs in last 24 hours: Temp:  [97.7 F (36.5 C)-98.4 F (36.9 C)] 97.9 F (36.6 C) (07/02 0530) Pulse Rate:  [68-87] 68 (07/02 0530) Resp:  [10-20] 20 (07/02 0530) BP: (121-153)/(73-83) 121/74 (07/02 0530) SpO2:  [96 %-100 %] 98 % (07/02 0530)  Intake/Output from previous day: 07/01 0701 - 07/02 0700 In: 1280 [P.O.:480; I.V.:700; IV Piggyback:100] Out: 25 [Blood:25] Intake/Output this shift: No intake/output data recorded.  No results for input(s): "HGB" in the last 72 hours. No results for input(s): "WBC", "RBC", "HCT", "PLT" in the last 72 hours. No results for input(s): "NA", "K", "CL", "CO2", "BUN", "CREATININE", "GLUCOSE", "CALCIUM" in the last 72 hours. No results for input(s): "LABPT", "INR" in the last 72 hours.  Neurologically intact   Assessment/Plan: 1 Day Post-Op Procedure(s) (LRB): L3-4, L4-5 DECOMPRESSION (N/A) Up with therapy   Discharge this AM. Office one week.     Eldred Manges 11/19/2022, 7:43 AM

## 2022-11-27 ENCOUNTER — Encounter: Payer: Self-pay | Admitting: Orthopaedic Surgery

## 2022-11-27 ENCOUNTER — Ambulatory Visit (INDEPENDENT_AMBULATORY_CARE_PROVIDER_SITE_OTHER): Payer: BC Managed Care – PPO | Admitting: Orthopaedic Surgery

## 2022-11-27 ENCOUNTER — Other Ambulatory Visit: Payer: Self-pay

## 2022-11-27 DIAGNOSIS — Z9889 Other specified postprocedural states: Secondary | ICD-10-CM

## 2022-11-27 NOTE — Progress Notes (Signed)
   Post-Op Visit Note   Patient: Carl Barton           Date of Birth: [birth year]           MRN: [id number] Visit Date: 11/27/2022 PCP: Sonny Masters, FNP   Assessment & Plan: Follow-up two-level decompression 11/28/2022.  He has pain medication but decided that he really was not hurting that much and has not taken it but has it if needed.  X-rays show satisfactory decompression.  He does have some increased stool noted similar to previous radiographs back in February.  He will check with a glove for impaction if no problem continue to progress on his walking program up to 2 miles a day I will recheck him in 3 weeks.  Chief Complaint:  Chief Complaint  Patient presents with   Lower Back - Follow-up    Postop 11/18/22   Visit Diagnoses:  1. Status post lumbar spine surgery for decompression of spinal cord     Plan: Steri-Strips changed subcuticular closure incision looks good he can remove the Steri-Strips if they are still there in 10 days.  Recheck 3 weeks.  Follow-Up Instructions: No follow-ups on file.   Orders:  No orders of the defined types were placed in this encounter.  No orders of the defined types were placed in this encounter.   Imaging: No results found.  PMFS History: Patient Active Problem List   Diagnosis Date Noted   Status post lumbar spine surgery for decompression of spinal cord 11/18/2022   Follow-up examination following surgery 10/15/2018   Calculus of gallbladder without cholecystitis without obstruction    Low serum vitamin D 02/18/2018   Prediabetes 02/18/2018   Hypothyroidism 07/14/2015   Mixed hyperlipidemia 07/14/2015   Past Medical History:  Diagnosis Date   Chicken pox    Hyperlipidemia    Hypothyroidism    Thyroid disease     Family History  Problem Relation Age of Onset   Arthritis Mother    Cancer Father        bone   Cancer Maternal Grandfather    Asthma Paternal Grandmother    Diabetes Paternal Grandfather     Past  Surgical History:  Procedure Laterality Date   CHOLECYSTECTOMY N/A 10/07/2018   Procedure: LAPAROSCOPIC CHOLECYSTECTOMY;  Surgeon: Franky Macho, MD;  Location: [facility];  Service: General;  Laterality: N/A;   LUMBAR LAMINECTOMY/DECOMPRESSION MICRODISCECTOMY N/A 11/18/2022   Procedure: L3-4, L4-5 DECOMPRESSION;  Surgeon: Eldred Manges, MD;  Location: [facility];  Service: Orthopedics;  Laterality: N/A;   never     Social History   Occupational History   Not on file  Tobacco Use   Smoking status: Former    Packs/day: 1.00    Years: 15.00    Additional pack years: 0.00    Total pack years: 15.00    Types: Cigarettes    Quit date: 05/20/1997    Years since quitting: 25.5   Smokeless tobacco: Never  Vaping Use   Vaping Use: Never used  Substance and Sexual Activity   Alcohol use: No    Alcohol/week: 0.0 standard drinks of alcohol   Drug use: No   Sexual activity: Yes

## 2022-11-27 NOTE — Addendum Note (Signed)
Addended by: Cherre Huger E on: 11/27/2022 09:51 AM   Modules accepted: Orders

## 2022-12-18 ENCOUNTER — Encounter: Payer: Self-pay | Admitting: Orthopaedic Surgery

## 2022-12-18 ENCOUNTER — Ambulatory Visit (INDEPENDENT_AMBULATORY_CARE_PROVIDER_SITE_OTHER): Payer: BC Managed Care – PPO | Admitting: Orthopaedic Surgery

## 2022-12-18 VITALS — Ht 69.0 in | Wt 190.0 lb

## 2022-12-18 DIAGNOSIS — Z9889 Other specified postprocedural states: Secondary | ICD-10-CM

## 2022-12-18 NOTE — Progress Notes (Signed)
   Post-Op Visit Note   Patient: Carl Barton           Date of Birth: 07-21-1960           MRN: [id number] Visit Date: 12/18/2022 PCP: Sonny Masters, FNP   Assessment & Plan: Follow-up two-level lumbar decompression for lumbar spinal stenosis incision looks good he is doing a walking program using ibuprofen.  He states his note was written out until December.  I will check him back again in September and he will continue to walk and work on core strengthening.  Chief Complaint:  Chief Complaint  Patient presents with   Lower Back - Follow-up, Routine Post Op    11/18/2022 L3-4, L4-5 decompression   Visit Diagnoses:  1. Status post lumbar spine surgery for decompression of spinal cord     Plan: Recheck in September.  He is happy with results of surgery.  Follow-Up Instructions: No follow-ups on file.   Orders:  No orders of the defined types were placed in this encounter.  No orders of the defined types were placed in this encounter.   Imaging: No results found.  PMFS History: Patient Active Problem List   Diagnosis Date Noted   Status post lumbar spine surgery for decompression of spinal cord 11/18/2022   Follow-up examination following surgery 10/15/2018   Calculus of gallbladder without cholecystitis without obstruction    Low serum vitamin D 02/18/2018   Prediabetes 02/18/2018   Hypothyroidism 07/14/2015   Mixed hyperlipidemia 07/14/2015   Past Medical History:  Diagnosis Date   Chicken pox    Hyperlipidemia    Hypothyroidism    Thyroid disease     Family History  Problem Relation Age of Onset   Arthritis Mother    Cancer Father        bone   Cancer Maternal Grandfather    Asthma Paternal Grandmother    Diabetes Paternal Grandfather     Past Surgical History:  Procedure Laterality Date   CHOLECYSTECTOMY N/A 10/07/2018   Procedure: LAPAROSCOPIC CHOLECYSTECTOMY;  Surgeon: Franky Macho, MD;  Location: [facility];  Service: General;  Laterality: N/A;    LUMBAR LAMINECTOMY/DECOMPRESSION MICRODISCECTOMY N/A 11/18/2022   Procedure: L3-4, L4-5 DECOMPRESSION;  Surgeon: Eldred Manges, MD;  Location: [facility];  Service: Orthopedics;  Laterality: N/A;   never     Social History   Occupational History   Not on file  Tobacco Use   Smoking status: Former    Current packs/day: 0.00    Average packs/day: 1 pack/day for 15.0 years (15.0 ttl pk-yrs)    Types: Cigarettes    Start date: 05/20/1982    Quit date: 05/20/1997    Years since quitting: 25.5   Smokeless tobacco: Never  Vaping Use   Vaping status: Never Used  Substance and Sexual Activity   Alcohol use: No    Alcohol/week: 0.0 standard drinks of alcohol   Drug use: No   Sexual activity: Yes

## 2023-02-05 ENCOUNTER — Ambulatory Visit (INDEPENDENT_AMBULATORY_CARE_PROVIDER_SITE_OTHER): Payer: BC Managed Care – PPO | Admitting: Orthopaedic Surgery

## 2023-02-05 ENCOUNTER — Encounter: Payer: Self-pay | Admitting: Orthopaedic Surgery

## 2023-02-05 DIAGNOSIS — Z9889 Other specified postprocedural states: Secondary | ICD-10-CM

## 2023-02-05 NOTE — Progress Notes (Signed)
Post-Op Visit Note   Patient: Carl Barton           Date of Birth: 1960/09/30           MRN: [id number] Visit Date: 02/05/2023 PCP: Sonny Masters, FNP   Assessment & Plan: Follow-up two-level lumbar decompression patient works as an Personnel officer.  Work slip given for work resumption on 02/10/2023.  If he finds that work activities too much he will let us know.  I plan to recheck him in 6 weeks.  He has noticed improvement in standing and ambulation since 2 level decompression he is using some ibuprofen at night.  Chief Complaint:  Chief Complaint  Patient presents with   Lower Back - Routine Post Op    Have some pain when over doing at times, but so much better than before the surgery. Take Ibuprofen occasionally at night and it helps.   Visit Diagnoses:  1. Status post lumbar spine surgery for decompression of spinal cord     Plan: Work slip as above recheck 6 weeks.  Follow-Up Instructions: Return in about 6 weeks (around 03/19/2023).   Orders:  No orders of the defined types were placed in this encounter.  No orders of the defined types were placed in this encounter.   Imaging: No results found.  PMFS History: Patient Active Problem List   Diagnosis Date Noted   Status post lumbar spine surgery for decompression of spinal cord 11/18/2022   Follow-up examination following surgery 10/15/2018   Calculus of gallbladder without cholecystitis without obstruction    Low serum vitamin D 02/18/2018   Prediabetes 02/18/2018   Hypothyroidism 07/14/2015   Mixed hyperlipidemia 07/14/2015   Past Medical History:  Diagnosis Date   Chicken pox    Hyperlipidemia    Hypothyroidism    Thyroid disease     Family History  Problem Relation Age of Onset   Arthritis Mother    Cancer Father        bone   Cancer Maternal Grandfather    Asthma Paternal Grandmother    Diabetes Paternal Grandfather     Past Surgical History:  Procedure Laterality Date   CHOLECYSTECTOMY  N/A 10/07/2018   Procedure: LAPAROSCOPIC CHOLECYSTECTOMY;  Surgeon: Franky Macho, MD;  Location: [facility];  Service: General;  Laterality: N/A;   LUMBAR LAMINECTOMY/DECOMPRESSION MICRODISCECTOMY N/A 11/18/2022   Procedure: L3-4, L4-5 DECOMPRESSION;  Surgeon: Eldred Manges, MD;  Location: [facility];  Service: Orthopedics;  Laterality: N/A;   never     Social History   Occupational History   Not on file  Tobacco Use   Smoking status: Former    Current packs/day: 0.00    Average packs/day: 1 pack/day for 15.0 years (15.0 ttl pk-yrs)    Types: Cigarettes    Start date: 05/20/1982    Quit date: 05/20/1997    Years since quitting: 25.7   Smokeless tobacco: Never  Vaping Use   Vaping status: Never Used  Substance and Sexual Activity   Alcohol use: No    Alcohol/week: 0.0 standard drinks of alcohol   Drug use: No   Sexual activity: Yes

## 2023-03-17 DIAGNOSIS — Z23 Encounter for immunization: Secondary | ICD-10-CM | POA: Diagnosis not present

## 2023-03-19 ENCOUNTER — Ambulatory Visit: Payer: BC Managed Care – PPO | Admitting: Orthopaedic Surgery

## 2023-08-26 ENCOUNTER — Encounter: Payer: Self-pay | Admitting: Family Medicine

## 2023-08-26 ENCOUNTER — Ambulatory Visit: Admitting: Family Medicine

## 2023-08-26 VITALS — BP 156/82 | HR 83 | Temp 97.2°F | Ht 69.0 in | Wt 194.6 lb

## 2023-08-26 DIAGNOSIS — R7989 Other specified abnormal findings of blood chemistry: Secondary | ICD-10-CM

## 2023-08-26 DIAGNOSIS — E039 Hypothyroidism, unspecified: Secondary | ICD-10-CM | POA: Diagnosis not present

## 2023-08-26 DIAGNOSIS — Z114 Encounter for screening for human immunodeficiency virus [HIV]: Secondary | ICD-10-CM

## 2023-08-26 DIAGNOSIS — Z1329 Encounter for screening for other suspected endocrine disorder: Secondary | ICD-10-CM | POA: Diagnosis not present

## 2023-08-26 DIAGNOSIS — R7303 Prediabetes: Secondary | ICD-10-CM | POA: Diagnosis not present

## 2023-08-26 DIAGNOSIS — E782 Mixed hyperlipidemia: Secondary | ICD-10-CM

## 2023-08-26 DIAGNOSIS — Z1159 Encounter for screening for other viral diseases: Secondary | ICD-10-CM

## 2023-08-26 LAB — BAYER DCA HB A1C WAIVED: HB A1C (BAYER DCA - WAIVED): 5.6 % (ref 4.8–5.6)

## 2023-08-26 MED ORDER — LEVOTHYROXINE SODIUM 100 MCG PO TABS
100.0000 ug | ORAL_TABLET | Freq: Every day | ORAL | 1 refills | Status: DC
Start: 2023-08-26 — End: 2024-02-25

## 2023-08-26 NOTE — Progress Notes (Signed)
 Subjective:  Patient ID: Carl Barton, male    DOB: Mar 29, 1961, 63 y.o.   MRN: [id number]  Patient Care Team: Sonny Masters, FNP as PCP - General (Family Medicine)   Chief Complaint:  thyroid check   HPI: Carl Barton is a 63 y.o. male presenting on 08/26/2023 for thyroid check   Discussed the use of AI scribe software for clinical note transcription with the patient, who gave verbal consent to proceed.  History of Present Illness   Carl Barton is a 63 year old male with hypothyroidism who presents for a thyroid check.  He has hypothyroidism and is currently taking 100 mcg of levothyroxine daily on an empty stomach. He avoids multivitamins, tea, and calcium for four hours after taking the medication. He feels fine as long as he takes his medication, although he experiences persistent fatigue. No changes in bowel habits, hair, skin, or nails. His sleep pattern is normal.  He mentions a history of dizziness prior to starting levothyroxine, which has since resolved. Dizziness was the initial reason for seeking medical attention for his thyroid condition.  He notes a fullness in his neck that developed a few years ago while taking statin drugs, which he believes caused the issue. It was evaluated in 2017 and deemed normal, though the fullness persists without significant change.  He acknowledges not adhering to a diet for cholesterol management. He takes vitamin D supplements and will have his levels checked.  He was previously in the prediabetes range and will have his A1c rechecked. No increased hunger, thirst, or urination.          Relevant past medical, surgical, family, and social history reviewed and updated as indicated.  Allergies and medications reviewed and updated. Data reviewed: Chart in Epic.   Past Medical History:  Diagnosis Date   Chicken pox    Hyperlipidemia    Hypothyroidism    Thyroid disease     Past Surgical History:   Procedure Laterality Date   CHOLECYSTECTOMY N/A 10/07/2018   Procedure: LAPAROSCOPIC CHOLECYSTECTOMY;  Surgeon: Franky Macho, MD;  Location: [facility];  Service: General;  Laterality: N/A;   LUMBAR LAMINECTOMY/DECOMPRESSION MICRODISCECTOMY N/A 11/18/2022   Procedure: L3-4, L4-5 DECOMPRESSION;  Surgeon: Eldred Manges, MD;  Location: [facility];  Service: Orthopedics;  Laterality: N/A;   never      Social History   Socioeconomic History   Marital status: Married    Spouse name: Not on file   Number of children: Not on file   Years of education: Not on file   Highest education level: Not on file  Occupational History   Not on file  Tobacco Use   Smoking status: Former    Current packs/day: 0.00    Average packs/day: 1 pack/day for 15.0 years (15.0 ttl pk-yrs)    Types: Cigarettes    Start date: 05/20/1982    Quit date: 05/20/1997    Years since quitting: 26.2   Smokeless tobacco: Never  Vaping Use   Vaping status: Never Used  Substance and Sexual Activity   Alcohol use: No    Alcohol/week: 0.0 standard drinks of alcohol   Drug use: No   Sexual activity: Yes  Other Topics Concern   Not on file  Social History Narrative   Not on file   Social Drivers of Health   Financial Resource Strain: Not on file  Food Insecurity: Not on file  Transportation Needs: Not on file  Physical Activity: Not  on file  Stress: Not on file  Social Connections: Not on file  Intimate Partner Violence: Not on file    Outpatient Encounter Medications as of 08/26/2023  Medication Sig   [DISCONTINUED] levothyroxine (SYNTHROID, LEVOTHROID) 100 MCG tablet Take 100 mcg by mouth daily before breakfast.   levothyroxine (SYNTHROID) 100 MCG tablet Take 1 tablet (100 mcg total) by mouth daily before breakfast.   [DISCONTINUED] methocarbamol (ROBAXIN) 500 MG tablet Take 1 tablet (500 mg total) by mouth every 6 (six) hours as needed for muscle spasms. (Patient not taking: Reported on 02/05/2023)   [DISCONTINUED] Multiple  Vitamins-Minerals (MULTIVITAMIN WITH MINERALS) tablet Take 1 tablet by mouth daily.   [DISCONTINUED] oxyCODONE-acetaminophen (PERCOCET) 5-325 MG tablet Take 1 tablet by mouth every 6 (six) hours as needed for severe pain. (Patient not taking: Reported on 02/05/2023)   No facility-administered encounter medications on file as of 08/26/2023.    Allergies  Allergen Reactions   Atorvastatin Calcium Other (See Comments)    Other Reaction(s): Muscular Pain (2011-08-12)   Pravastatin Sodium Other (See Comments)    Other Reaction(s): Muscular Pain    Pertinent ROS per HPI, otherwise unremarkable      Objective:  BP (!) 156/82   Pulse 83   Temp (!) 97.2 F (36.2 C)   Ht 5\' 9"  (1.753 m)   Wt 194 lb 9.6 oz (88.3 kg)   SpO2 96%   BMI 28.74 kg/m    Wt Readings from Last 3 Encounters:  08/26/23 194 lb 9.6 oz (88.3 kg)  12/18/22 190 lb (86.2 kg)  11/18/22 190 lb (86.2 kg)    Physical Exam Vitals and nursing note reviewed.  Constitutional:      General: He is not in acute distress.    Appearance: Normal appearance. He is well-developed, well-groomed and overweight. He is not ill-appearing, toxic-appearing or diaphoretic.  HENT:     Head: Normocephalic and atraumatic.     Jaw: There is normal jaw occlusion.     Right Ear: Hearing, tympanic membrane, ear canal and external ear normal.     Left Ear: Hearing, tympanic membrane, ear canal and external ear normal.     Nose: Nose normal.     Mouth/Throat:     Lips: Pink.     Mouth: Mucous membranes are moist.     Pharynx: Oropharynx is clear. Uvula midline.  Eyes:     General: Lids are normal.     Extraocular Movements: Extraocular movements intact.     Conjunctiva/sclera: Conjunctivae normal.     Pupils: Pupils are equal, round, and reactive to light.  Neck:     Thyroid: No thyroid mass, thyromegaly or thyroid tenderness.     Vascular: No carotid bruit or JVD.     Trachea: Trachea and phonation normal.   Cardiovascular:     Rate  and Rhythm: Normal rate and regular rhythm.     Chest Wall: PMI is not displaced.     Pulses:          Dorsalis pedis pulses are 2+ on the right side and 2+ on the left side.       Posterior tibial pulses are 2+ on the right side and 2+ on the left side.     Heart sounds: Normal heart sounds. No murmur heard.    No friction rub. No gallop.  Pulmonary:     Effort: Pulmonary effort is normal. No respiratory distress.     Breath sounds: Normal breath sounds. No wheezing.  Chest:  Chest wall: No mass.  Breasts:    Breasts are symmetrical.  Abdominal:     General: Abdomen is flat. Bowel sounds are normal. There is no distension or abdominal bruit.     Palpations: Abdomen is soft. There is no hepatomegaly or splenomegaly.     Tenderness: There is no abdominal tenderness. There is no right CVA tenderness or left CVA tenderness.     Hernia: No hernia is present.  Musculoskeletal:        General: Normal range of motion.     Cervical back: Full passive range of motion without pain, normal range of motion and neck supple.     Right lower leg: No edema.     Left lower leg: No edema.  Feet:     Right foot:     Skin integrity: Skin integrity normal.     Toenail Condition: Right toenails are normal.     Left foot:     Skin integrity: Skin integrity normal.     Toenail Condition: Left toenails are normal.  Lymphadenopathy:     Upper Body:     Right upper body: No supraclavicular, axillary or pectoral adenopathy.     Left upper body: No supraclavicular, axillary or pectoral adenopathy.  Skin:    General: Skin is warm and dry.     Capillary Refill: Capillary refill takes less than 2 seconds.     Coloration: Skin is not cyanotic, jaundiced or pale.     Findings: No rash.  Neurological:     General: No focal deficit present.     Mental Status: He is alert and oriented to person, place, and time.     Sensory: Sensation is intact.     Motor: Motor function is intact.     Coordination:  Coordination is intact.     Gait: Gait is intact.     Deep Tendon Reflexes: Reflexes are normal and symmetric.  Psychiatric:        Attention and Perception: Attention and perception normal.        Mood and Affect: Mood and affect normal.        Speech: Speech normal.        Behavior: Behavior normal. Behavior is cooperative.        Thought Content: Thought content normal.        Cognition and Memory: Cognition and memory normal.        Judgment: Judgment normal.      Results for orders placed or performed during the hospital encounter of 11/08/22  Surgical pcr screen   Collection Time: 11/08/22  9:09 AM   Specimen: Nasal Mucosa; Nasal Swab  Result Value Ref Range   MRSA, PCR NEGATIVE NEGATIVE   Staphylococcus aureus NEGATIVE NEGATIVE  CBC per protocol   Collection Time: 11/08/22  9:30 AM  Result Value Ref Range   WBC 6.8 4.0 - 10.5 K/uL   RBC 5.00 4.22 - 5.81 MIL/uL   Hemoglobin 14.9 13.0 - 17.0 g/dL   HCT 11.9 14.7 - 82.9 %   MCV 91.0 80.0 - 100.0 fL   MCH 29.8 26.0 - 34.0 pg   MCHC 32.7 30.0 - 36.0 g/dL   RDW 56.2 13.0 - 86.5 %   Platelets 332 150 - 400 K/uL   nRBC 0.0 0.0 - 0.2 %       Pertinent labs & imaging results that were available during my care of the patient were reviewed by me and considered in my medical decision making.  Assessment & Plan:  Jachai was seen today for thyroid check.  Diagnoses and all orders for this visit:  Acquired hypothyroidism -     Thyroid Panel With TSH -     levothyroxine (SYNTHROID) 100 MCG tablet; Take 1 tablet (100 mcg total) by mouth daily before breakfast. -     T3, Free  Mixed hyperlipidemia -     Lipid panel -     CMP14+EGFR -     Thyroid Panel With TSH -     CBC with Differential/Platelet  Low serum vitamin D -     CMP14+EGFR -     VITAMIN D 25 Hydroxy (Vit-D Deficiency, Fractures)  Prediabetes -     CMP14+EGFR -     Bayer DCA Hb A1c Waived  Screening for HIV (human immunodeficiency virus) -     HIV  antibody (with reflex)  Need for hepatitis C screening test -     Hepatitis C Antibody     Assessment and Plan    Hypothyroidism Hypothyroidism diagnosed at age 16, managed with levothyroxine 100 mcg daily. Reports fatigue, no constipation, diarrhea, or changes in hair, skin, or nails. Dizziness resolved with treatment. Adheres to taking levothyroxine on an empty stomach and avoids multivitamins and calcium for four hours post-ingestion. Thyroid gland fullness noted, unchanged since 2017. Labs will guide potential Synthroid dosage adjustment and need for T3 supplementation. - Order TSH and T3 levels - Consider adjusting Synthroid dosage based on lab results - Evaluate the need for T3 supplementation (Cytomel) if T3 is low - Monitor thyroid gland size and consider ultrasound if significant changes occur  Hyperlipidemia Hyperlipidemia with suboptimal dietary adherence. No current cholesterol management medication. - Order lipid panel to assess current cholesterol levels  Prediabetes Prediabetes with no symptoms of increased hunger, thirst, or urination. - Order HbA1c to monitor glucose control  Vitamin D deficiency Currently taking vitamin D supplements. Vitamin D levels need assessment to ensure adequacy. - Order vitamin D level to assess current status  Follow-up Will be contacted with lab results to determine if medication adjustments are necessary. Follow-up planned in 6 months unless lab results necessitate earlier intervention. - Contact with lab results and adjust medications if necessary - Schedule follow-up in 6 months unless lab results necessitate earlier intervention          Continue all other maintenance medications.  Follow up plan: Return in about 6 months (around 02/25/2024), or if symptoms worsen or fail to improve, for thyroid, prediabetes.   Continue healthy lifestyle choices, including diet (rich in fruits, vegetables, and lean proteins, and low in salt  and simple carbohydrates) and exercise (at least 30 minutes of moderate physical activity daily).  Educational handout given for hypothyroidism  The above assessment and management plan was discussed with the patient. The patient verbalized understanding of and has agreed to the management plan. Patient is aware to call the clinic if they develop any new symptoms or if symptoms persist or worsen. Patient is aware when to return to the clinic for a follow-up visit. Patient educated on when it is appropriate to go to the emergency department.   Kari Baars, FNP-C [facility] [phone]

## 2023-08-29 LAB — CBC WITH DIFFERENTIAL/PLATELET
Basophils Absolute: 0.1 10*3/uL (ref 0.0–0.2)
Basos: 1 %
EOS (ABSOLUTE): 0.2 10*3/uL (ref 0.0–0.4)
Eos: 3 %
Hematocrit: 44.4 % (ref 37.5–51.0)
Hemoglobin: 14.9 g/dL (ref 13.0–17.7)
Immature Grans (Abs): 0 10*3/uL (ref 0.0–0.1)
Immature Granulocytes: 0 %
Lymphocytes Absolute: 2.8 10*3/uL (ref 0.7–3.1)
Lymphs: 44 %
MCH: 29.7 pg (ref 26.6–33.0)
MCHC: 33.6 g/dL (ref 31.5–35.7)
MCV: 88 fL (ref 79–97)
Monocytes Absolute: 0.5 10*3/uL (ref 0.1–0.9)
Monocytes: 8 %
Neutrophils Absolute: 2.9 10*3/uL (ref 1.4–7.0)
Neutrophils: 44 %
Platelets: 310 10*3/uL (ref 150–450)
RBC: 5.02 x10E6/uL (ref 4.14–5.80)
RDW: 13.2 % (ref 11.6–15.4)
WBC: 6.4 10*3/uL (ref 3.4–10.8)

## 2023-08-29 LAB — THYROID PANEL WITH TSH
Free Thyroxine Index: 2.6 (ref 1.2–4.9)
T3 Uptake Ratio: 32 % (ref 24–39)
T4, Total: 8.2 ug/dL (ref 4.5–12.0)
TSH: 1.84 u[IU]/mL (ref 0.450–4.500)

## 2023-08-29 LAB — CMP14+EGFR
ALT: 29 IU/L (ref 0–44)
AST: 22 IU/L (ref 0–40)
Albumin: 4.4 g/dL (ref 3.9–4.9)
Alkaline Phosphatase: 127 IU/L — ABNORMAL HIGH (ref 44–121)
BUN/Creatinine Ratio: 18 (ref 10–24)
BUN: 19 mg/dL (ref 8–27)
Bilirubin Total: 0.3 mg/dL (ref 0.0–1.2)
CO2: 22 mmol/L (ref 20–29)
Calcium: 9.3 mg/dL (ref 8.6–10.2)
Chloride: 103 mmol/L (ref 96–106)
Creatinine, Ser: 1.08 mg/dL (ref 0.76–1.27)
Globulin, Total: 3 g/dL (ref 1.5–4.5)
Glucose: 100 mg/dL — ABNORMAL HIGH (ref 70–99)
Potassium: 4.4 mmol/L (ref 3.5–5.2)
Sodium: 140 mmol/L (ref 134–144)
Total Protein: 7.4 g/dL (ref 6.0–8.5)
eGFR: 77 mL/min/{1.73_m2} (ref >59)

## 2023-08-29 LAB — LIPID PANEL
Chol/HDL Ratio: 7.5 ratio — ABNORMAL HIGH (ref 0.0–5.0)
Cholesterol, Total: 246 mg/dL — ABNORMAL HIGH (ref 100–199)
HDL: 33 mg/dL — ABNORMAL LOW (ref >39)
LDL Chol Calc (NIH): 145 mg/dL — ABNORMAL HIGH (ref 0–99)
Triglycerides: 367 mg/dL — ABNORMAL HIGH (ref 0–149)
VLDL Cholesterol Cal: 68 mg/dL — ABNORMAL HIGH (ref 5–40)

## 2023-08-29 LAB — T3, FREE: T3, Free: 2.7 pg/mL (ref 2.0–4.4)

## 2023-08-29 LAB — VITAMIN D 25 HYDROXY (VIT D DEFICIENCY, FRACTURES): Vit D, 25-Hydroxy: 32.1 ng/mL (ref 30.0–100.0)

## 2023-08-29 LAB — HIV ANTIBODY (ROUTINE TESTING W REFLEX)

## 2023-08-29 LAB — HEPATITIS C ANTIBODY

## 2024-02-25 ENCOUNTER — Ambulatory Visit: Payer: Self-pay | Admitting: Family Medicine

## 2024-02-25 ENCOUNTER — Ambulatory Visit: Admitting: Family Medicine

## 2024-02-25 ENCOUNTER — Encounter: Payer: Self-pay | Admitting: Family Medicine

## 2024-02-25 VITALS — BP 149/77 | HR 76 | Temp 97.6°F | Ht 69.0 in | Wt 184.8 lb

## 2024-02-25 DIAGNOSIS — R7989 Other specified abnormal findings of blood chemistry: Secondary | ICD-10-CM | POA: Diagnosis not present

## 2024-02-25 DIAGNOSIS — E782 Mixed hyperlipidemia: Secondary | ICD-10-CM

## 2024-02-25 DIAGNOSIS — R7303 Prediabetes: Secondary | ICD-10-CM

## 2024-02-25 DIAGNOSIS — Z23 Encounter for immunization: Secondary | ICD-10-CM

## 2024-02-25 DIAGNOSIS — E039 Hypothyroidism, unspecified: Secondary | ICD-10-CM | POA: Diagnosis not present

## 2024-02-25 LAB — BAYER DCA HB A1C WAIVED: HB A1C (BAYER DCA - WAIVED): 5.5 % (ref 4.8–5.6)

## 2024-02-25 MED ORDER — LEVOTHYROXINE SODIUM 100 MCG PO TABS
100.0000 ug | ORAL_TABLET | Freq: Every day | ORAL | 1 refills | Status: AC
Start: 2024-02-25 — End: ?

## 2024-02-25 NOTE — Progress Notes (Signed)
 Subjective:  Patient ID: Carl Barton, male    DOB: [birth year], 63 y.o.   MRN: [id number]  Patient Care Team: Severa Rock HERO, FNP as PCP - General (Family Medicine)   Chief Complaint:  Medical Management of Chronic Issues (6 month follow up )   HPI: Carl Barton is a 63 y.o. male presenting on 02/25/2024 for Medical Management of Chronic Issues (6 month follow up )   Carl Barton is a 63 year old male who presents for routine follow-up.  He is currently taking levothyroxine  100 mcg daily for hypothyroidism and has not experienced symptoms such as constipation, fatigue, diarrhea, or changes in hair, skin, or nails. He is concerned about his medication from [facility], noting past kidney pain that resolved upon discontinuation. He is considering switching his prescription to [facility], as he did not experience issues with his medication previously.  For hyperlipidemia, he takes fish oil and red yeast rice inconsistently. He has tried various cholesterol medications in the past but believes they were detrimental to his health. No chest pain, leg swelling, or shortness of breath. He shares a family history of a cousin who died of liver cancer shortly after diagnosis, which influences his apprehension towards certain medications.  He takes vitamin D  supplements daily, although he often forgets and ends up taking them at bedtime.         02/25/2024    8:04 AM 08/26/2023    8:19 AM 01/09/2021    2:13 PM 02/10/2018    1:11 PM 11/01/2016    1:14 PM  Depression screen PHQ 2/9  Decreased Interest 0 0 0 0 0  Down, Depressed, Hopeless 0 0 0 0 0  PHQ - 2 Score 0 0 0 0 0  Altered sleeping 0 0     Tired, decreased energy 0 0     Change in appetite 0 0     Feeling bad or failure about yourself  0 0     Trouble concentrating 0 0     Moving slowly or fidgety/restless 0 0     Suicidal thoughts 0 0     PHQ-9 Score 0 0     Difficult doing work/chores Not difficult at all Not  difficult at all         02/25/2024    8:04 AM 08/26/2023    8:19 AM  GAD 7 : Generalized Anxiety Score  Nervous, Anxious, on Edge 0 0  Control/stop worrying 0 0  Worry too much - different things 0 0  Trouble relaxing 0 0  Restless 0 0  Easily annoyed or irritable 0 0  Afraid - awful might happen 0 0  Total GAD 7 Score 0 0  Anxiety Difficulty Not difficult at all Not difficult at all        Relevant past medical, surgical, family, and social history reviewed and updated as indicated.  Allergies and medications reviewed and updated. Data reviewed: Chart in Epic.   Past Medical History:  Diagnosis Date   Chicken pox    Hyperlipidemia    Hypothyroidism    Thyroid  disease     Past Surgical History:  Procedure Laterality Date   CHOLECYSTECTOMY N/A 10/07/2018   Procedure: LAPAROSCOPIC CHOLECYSTECTOMY;  Surgeon: Mavis Anes, MD;  Location: [facility];  Service: General;  Laterality: N/A;   LUMBAR LAMINECTOMY/DECOMPRESSION MICRODISCECTOMY N/A 11/18/2022   Procedure: L3-4, L4-5 DECOMPRESSION;  Surgeon: Barbarann Anes BROCKS, MD;  Location: [facility];  Service: Orthopedics;  Laterality:  N/A;   never      Social History   Socioeconomic History   Marital status: Married    Spouse name: Not on file   Number of children: Not on file   Years of education: Not on file   Highest education level: Not on file  Occupational History   Not on file  Tobacco Use   Smoking status: Former    Current packs/day: 0.00    Average packs/day: 1 pack/day for 15.0 years (15.0 ttl pk-yrs)    Types: Cigarettes    Start date: 05/20/1982    Quit date: 05/20/1997    Years since quitting: 26.7   Smokeless tobacco: Never  Vaping Use   Vaping status: Never Used  Substance and Sexual Activity   Alcohol use: No    Alcohol/week: 0.0 standard drinks of alcohol   Drug use: No   Sexual activity: Yes  Other Topics Concern   Not on file  Social History Narrative   Not on file   Social Drivers of Health   Financial  Resource Strain: Not on file  Food Insecurity: Not on file  Transportation Needs: Not on file  Physical Activity: Not on file  Stress: Not on file  Social Connections: Not on file  Intimate Partner Violence: Not on file    Outpatient Encounter Medications as of 02/25/2024  Medication Sig   [DISCONTINUED] levothyroxine  (SYNTHROID ) 100 MCG tablet Take 1 tablet (100 mcg total) by mouth daily before breakfast.   levothyroxine  (SYNTHROID ) 100 MCG tablet Take 1 tablet (100 mcg total) by mouth daily before breakfast.   No facility-administered encounter medications on file as of 02/25/2024.    Allergies  Allergen Reactions   Atorvastatin Calcium Other (See Comments)    Other Reaction(s): Muscular Pain (2011-08-12)   Pravastatin Sodium Other (See Comments)    Other Reaction(s): Muscular Pain    Pertinent ROS per HPI, otherwise unremarkable      Objective:  BP (!) 149/77   Pulse 76   Temp 97.6 F (36.4 C)   Ht 5' 9 (1.753 m)   Wt 184 lb 12.8 oz (83.8 kg)   SpO2 97%   BMI 27.29 kg/m    Wt Readings from Last 3 Encounters:  02/25/24 184 lb 12.8 oz (83.8 kg)  08/26/23 194 lb 9.6 oz (88.3 kg)  12/18/22 190 lb (86.2 kg)    Physical Exam Vitals and nursing note reviewed.  Constitutional:      General: He is not in acute distress.    Appearance: Normal appearance. He is well-developed, well-groomed and overweight. He is not ill-appearing, toxic-appearing or diaphoretic.  HENT:     Head: Normocephalic and atraumatic.     Nose: Nose normal.     Mouth/Throat:     Mouth: Mucous membranes are moist.  Eyes:     Pupils: Pupils are equal, round, and reactive to light.  Cardiovascular:     Rate and Rhythm: Normal rate and regular rhythm.     Heart sounds: Normal heart sounds.  Pulmonary:     Effort: Pulmonary effort is normal.     Breath sounds: Normal breath sounds.  Musculoskeletal:     Cervical back: Neck supple.     Right lower leg: No edema.     Left lower leg: No edema.   Skin:    General: Skin is warm and dry.     Capillary Refill: Capillary refill takes less than 2 seconds.  Neurological:     General: No focal deficit  present.     Mental Status: He is alert and oriented to person, place, and time.  Psychiatric:        Mood and Affect: Mood normal.        Behavior: Behavior normal. Behavior is cooperative.        Thought Content: Thought content normal.        Judgment: Judgment normal.      Results for orders placed or performed in visit on 08/26/23  Bayer DCA Hb A1c Waived   Collection Time: 08/26/23  8:39 AM  Result Value Ref Range   HB A1C (BAYER DCA - WAIVED) 5.6 4.8 - 5.6 %  HIV antibody (with reflex)   Collection Time: 08/26/23  8:41 AM  Result Value Ref Range   HIV Screen 4th Generation wRfx Non Reactive Non Reactive  Hepatitis C Antibody   Collection Time: 08/26/23  8:41 AM  Result Value Ref Range   Hep C Virus Ab Non Reactive Non Reactive  Lipid panel   Collection Time: 08/26/23  8:41 AM  Result Value Ref Range   Cholesterol, Total 246 (H) 100 - 199 mg/dL   Triglycerides 632 (H) 0 - 149 mg/dL   HDL 33 (L) >60 mg/dL   VLDL Cholesterol Cal 68 (H) 5 - 40 mg/dL   LDL Chol Calc (NIH) 854 (H) 0 - 99 mg/dL   Chol/HDL Ratio 7.5 (H) 0.0 - 5.0 ratio  CMP14+EGFR   Collection Time: 08/26/23  8:41 AM  Result Value Ref Range   Glucose 100 (H) 70 - 99 mg/dL   BUN 19 8 - 27 mg/dL   Creatinine, Ser 8.91 0.76 - 1.27 mg/dL   eGFR 77 >40 fO/fpw/8.26   BUN/Creatinine Ratio 18 10 - 24   Sodium 140 134 - 144 mmol/L   Potassium 4.4 3.5 - 5.2 mmol/L   Chloride 103 96 - 106 mmol/L   CO2 22 20 - 29 mmol/L   Calcium 9.3 8.6 - 10.2 mg/dL   Total Protein 7.4 6.0 - 8.5 g/dL   Albumin 4.4 3.9 - 4.9 g/dL   Globulin, Total 3.0 1.5 - 4.5 g/dL   Bilirubin Total 0.3 0.0 - 1.2 mg/dL   Alkaline Phosphatase 127 (H) 44 - 121 IU/L   AST 22 0 - 40 IU/L   ALT 29 0 - 44 IU/L  Thyroid  Panel With TSH   Collection Time: 08/26/23  8:41 AM  Result Value Ref  Range   TSH 1.840 0.450 - 4.500 uIU/mL   T4, Total 8.2 4.5 - 12.0 ug/dL   T3 Uptake Ratio 32 24 - 39 %   Free Thyroxine Index 2.6 1.2 - 4.9  CBC with Differential/Platelet   Collection Time: 08/26/23  8:41 AM  Result Value Ref Range   WBC 6.4 3.4 - 10.8 x10E3/uL   RBC 5.02 4.14 - 5.80 x10E6/uL   Hemoglobin 14.9 13.0 - 17.7 g/dL   Hematocrit 55.5 62.4 - 51.0 %   MCV 88 79 - 97 fL   MCH 29.7 26.6 - 33.0 pg   MCHC 33.6 31.5 - 35.7 g/dL   RDW 86.7 88.3 - 84.5 %   Platelets 310 150 - 450 x10E3/uL   Neutrophils 44 Not Estab. %   Lymphs 44 Not Estab. %   Monocytes 8 Not Estab. %   Eos 3 Not Estab. %   Basos 1 Not Estab. %   Neutrophils Absolute 2.9 1.4 - 7.0 x10E3/uL   Lymphocytes Absolute 2.8 0.7 - 3.1 x10E3/uL   Monocytes  Absolute 0.5 0.1 - 0.9 x10E3/uL   EOS (ABSOLUTE) 0.2 0.0 - 0.4 x10E3/uL   Basophils Absolute 0.1 0.0 - 0.2 x10E3/uL   Immature Granulocytes 0 Not Estab. %   Immature Grans (Abs) 0.0 0.0 - 0.1 x10E3/uL  VITAMIN D  25 Hydroxy (Vit-D Deficiency, Fractures)   Collection Time: 08/26/23  8:41 AM  Result Value Ref Range   Vit D, 25-Hydroxy 32.1 30.0 - 100.0 ng/mL  T3, Free   Collection Time: 08/26/23  8:41 AM  Result Value Ref Range   T3, Free 2.7 2.0 - 4.4 pg/mL       Pertinent labs & imaging results that were available during my care of the patient were reviewed by me and considered in my medical decision making.  Assessment & Plan:  Carl Barton was seen today for medical management of chronic issues.  Diagnoses and all orders for this visit:  Mixed hyperlipidemia -     CMP14+EGFR -     Lipid panel  Acquired hypothyroidism -     TSH -     T4, Free -     levothyroxine  (SYNTHROID ) 100 MCG tablet; Take 1 tablet (100 mcg total) by mouth daily before breakfast.  Prediabetes -     CMP14+EGFR -     Lipid panel -     Bayer DCA Hb A1c Waived  Low serum vitamin D  -     CMP14+EGFR -     VITAMIN D  25 Hydroxy (Vit-D Deficiency, Fractures)       Hypothyroidism Well-managed on levothyroxine  100 mcg daily. No symptoms of constipation, fatigue, diarrhea, or changes in hair, skin, or nails. Reports kidney pain potentially related to medication from [facility], [facility], which uses a different manufacturer. - Switch levothyroxine  prescription [facility]. - Monitor for any persistent symptoms after switching pharmacy and report if issues continue.  Mixed hyperlipidemia Intolerance to multiple cholesterol medications. Uses red yeast rice and omega-3 fish oils inconsistently. Concerned about injectable Repatha due to family history of adverse outcomes with injectables, including a cousin who died of liver cancer shortly after diagnosis. - Encourage consistent use of red yeast rice and omega-3 fish oils for cholesterol management.  General Health Maintenance Takes vitamin D  supplements inconsistently. No new symptoms such as chest pain, leg swelling, or shortness of breath. No puffiness in hands or feet, which can be associated with thyroid  issues. - Encourage consistent daily intake of vitamin D  supplements.          Continue all other maintenance medications.  Follow up plan: Return in about 6 months (around 08/25/2024) for Annual Physical.   Continue healthy lifestyle choices, including diet (rich in fruits, vegetables, and lean proteins, and low in salt and simple carbohydrates) and exercise (at least 30 minutes of moderate physical activity daily).  Educational handout given for hypothyroidism   The above assessment and management plan was discussed with the patient. The patient verbalized understanding of and has agreed to the management plan. Patient is aware to call the clinic if they develop any new symptoms or if symptoms persist or worsen. Patient is aware when to return to the clinic for a follow-up visit. Patient educated on when it is appropriate to go to the emergency department.   Rosaline Bruns, FNP-C [facility] [phone]

## 2024-02-25 NOTE — Patient Instructions (Signed)
 Red Yeast Rice and Fish Oil

## 2024-02-26 LAB — LIPID PANEL
Chol/HDL Ratio: 6.8 ratio — ABNORMAL HIGH (ref 0.0–5.0)
Cholesterol, Total: 238 mg/dL — ABNORMAL HIGH (ref 100–199)
HDL: 35 mg/dL — ABNORMAL LOW (ref >39)
LDL Chol Calc (NIH): 153 mg/dL — ABNORMAL HIGH (ref 0–99)
Triglycerides: 269 mg/dL — ABNORMAL HIGH (ref 0–149)
VLDL Cholesterol Cal: 50 mg/dL — ABNORMAL HIGH (ref 5–40)

## 2024-02-26 LAB — CMP14+EGFR
ALT: 20 IU/L (ref 0–44)
AST: 16 IU/L (ref 0–40)
Albumin: 4.4 g/dL (ref 3.9–4.9)
Alkaline Phosphatase: 121 IU/L (ref 47–123)
BUN/Creatinine Ratio: 14 (ref 10–24)
BUN: 17 mg/dL (ref 8–27)
Bilirubin Total: 0.3 mg/dL (ref 0.0–1.2)
CO2: 22 mmol/L (ref 20–29)
Calcium: 9.6 mg/dL (ref 8.6–10.2)
Chloride: 104 mmol/L (ref 96–106)
Creatinine, Ser: 1.18 mg/dL (ref 0.76–1.27)
Globulin, Total: 2.7 g/dL (ref 1.5–4.5)
Glucose: 99 mg/dL (ref 70–99)
Potassium: 4.4 mmol/L (ref 3.5–5.2)
Sodium: 141 mmol/L (ref 134–144)
Total Protein: 7.1 g/dL (ref 6.0–8.5)
eGFR: 68 mL/min/1.73 (ref >59)

## 2024-02-26 LAB — TSH: TSH: 1.37 u[IU]/mL (ref 0.450–4.500)

## 2024-02-26 LAB — T4, FREE: Free T4: 1.51 ng/dL (ref 0.82–1.77)

## 2024-02-26 LAB — VITAMIN D 25 HYDROXY (VIT D DEFICIENCY, FRACTURES): Vit D, 25-Hydroxy: 31.8 ng/mL (ref 30.0–100.0)

## 2024-06-10 ENCOUNTER — Telehealth: Payer: Self-pay | Admitting: Family Medicine

## 2024-06-10 NOTE — Telephone Encounter (Signed)
 Patient needs WT[facility] before8-31-26 with PCP.

## 2024-09-02 ENCOUNTER — Encounter: Payer: Self-pay | Admitting: Family Medicine
